# Patient Record
Sex: Female | Born: 1955
Health system: Southern US, Community
[De-identification: ages and names within clinical notes are randomized; demographics above are authoritative.]

## PROBLEM LIST (undated history)

## (undated) DIAGNOSIS — H269 Unspecified cataract: Secondary | ICD-10-CM

## (undated) DIAGNOSIS — M791 Myalgia, unspecified site: Secondary | ICD-10-CM

## (undated) DIAGNOSIS — C4491 Basal cell carcinoma of skin, unspecified: Secondary | ICD-10-CM

## (undated) DIAGNOSIS — N63 Unspecified lump in unspecified breast: Secondary | ICD-10-CM

## (undated) DIAGNOSIS — Q667 Congenital pes cavus, unspecified foot: Secondary | ICD-10-CM

## (undated) DIAGNOSIS — F419 Anxiety disorder, unspecified: Secondary | ICD-10-CM

## (undated) DIAGNOSIS — Z86010 Personal history of colonic polyps: Secondary | ICD-10-CM

## (undated) DIAGNOSIS — F329 Major depressive disorder, single episode, unspecified: Secondary | ICD-10-CM

## (undated) DIAGNOSIS — M7631 Iliotibial band syndrome, right leg: Secondary | ICD-10-CM

## (undated) DIAGNOSIS — F32A Depression, unspecified: Secondary | ICD-10-CM

## (undated) DIAGNOSIS — Z9189 Other specified personal risk factors, not elsewhere classified: Secondary | ICD-10-CM

## (undated) HISTORY — DX: Basal cell carcinoma of skin, unspecified: C44.91

## (undated) HISTORY — DX: Depression, unspecified: F32.A

## (undated) HISTORY — DX: Iliotibial band syndrome, right leg: M76.31

## (undated) HISTORY — DX: Myalgia, unspecified site: M79.10

## (undated) HISTORY — PX: NO PAST SURGERIES: SHX2092

## (undated) HISTORY — DX: Unspecified lump in unspecified breast: N63.0

## (undated) HISTORY — DX: Unspecified cataract: H26.9

## (undated) HISTORY — DX: Congenital pes cavus, unspecified foot: Q66.70

## (undated) HISTORY — DX: Other specified personal risk factors, not elsewhere classified: Z91.89

## (undated) HISTORY — DX: Major depressive disorder, single episode, unspecified: F32.9

## (undated) HISTORY — DX: Anxiety disorder, unspecified: F41.9

## (undated) HISTORY — DX: Personal history of colonic polyps: Z86.010

---

## 1996-04-26 DIAGNOSIS — N63 Unspecified lump in unspecified breast: Secondary | ICD-10-CM

## 1996-04-26 HISTORY — DX: Unspecified lump in unspecified breast: N63.0

## 1999-05-05 ENCOUNTER — Other Ambulatory Visit: Admission: RE | Admit: 1999-05-05 | Discharge: 1999-05-05 | Payer: Self-pay | Admitting: *Deleted

## 2000-07-12 ENCOUNTER — Other Ambulatory Visit: Admission: RE | Admit: 2000-07-12 | Discharge: 2000-07-12 | Payer: Self-pay | Admitting: *Deleted

## 2000-07-23 ENCOUNTER — Encounter: Admission: RE | Admit: 2000-07-23 | Discharge: 2000-07-23 | Payer: Self-pay | Admitting: *Deleted

## 2000-07-23 ENCOUNTER — Encounter: Payer: Self-pay | Admitting: *Deleted

## 2001-06-26 ENCOUNTER — Other Ambulatory Visit: Admission: RE | Admit: 2001-06-26 | Discharge: 2001-06-26 | Payer: Self-pay | Admitting: *Deleted

## 2001-08-06 ENCOUNTER — Encounter: Admission: RE | Admit: 2001-08-06 | Discharge: 2001-08-06 | Payer: Self-pay | Admitting: *Deleted

## 2001-08-06 ENCOUNTER — Encounter: Payer: Self-pay | Admitting: *Deleted

## 2002-07-03 ENCOUNTER — Other Ambulatory Visit: Admission: RE | Admit: 2002-07-03 | Discharge: 2002-07-03 | Payer: Self-pay | Admitting: *Deleted

## 2002-07-20 ENCOUNTER — Encounter: Payer: Self-pay | Admitting: Orthopedic Surgery

## 2002-07-20 ENCOUNTER — Encounter: Admission: RE | Admit: 2002-07-20 | Discharge: 2002-07-20 | Payer: Self-pay | Admitting: Orthopedic Surgery

## 2002-08-10 ENCOUNTER — Encounter: Admission: RE | Admit: 2002-08-10 | Discharge: 2002-08-10 | Payer: Self-pay | Admitting: Diagnostic Radiology

## 2002-08-10 ENCOUNTER — Encounter: Payer: Self-pay | Admitting: Diagnostic Radiology

## 2002-10-02 ENCOUNTER — Encounter: Admission: RE | Admit: 2002-10-02 | Discharge: 2002-10-02 | Payer: Self-pay | Admitting: *Deleted

## 2002-10-02 ENCOUNTER — Encounter: Payer: Self-pay | Admitting: *Deleted

## 2003-07-14 ENCOUNTER — Other Ambulatory Visit: Admission: RE | Admit: 2003-07-14 | Discharge: 2003-07-14 | Payer: Self-pay | Admitting: *Deleted

## 2003-12-07 ENCOUNTER — Encounter: Admission: RE | Admit: 2003-12-07 | Discharge: 2003-12-07 | Payer: Self-pay | Admitting: *Deleted

## 2004-07-17 ENCOUNTER — Other Ambulatory Visit: Admission: RE | Admit: 2004-07-17 | Discharge: 2004-07-17 | Payer: Self-pay | Admitting: *Deleted

## 2005-01-08 ENCOUNTER — Encounter: Admission: RE | Admit: 2005-01-08 | Discharge: 2005-01-08 | Payer: Self-pay | Admitting: *Deleted

## 2005-07-18 ENCOUNTER — Other Ambulatory Visit: Admission: RE | Admit: 2005-07-18 | Discharge: 2005-07-18 | Payer: Self-pay | Admitting: *Deleted

## 2006-01-23 ENCOUNTER — Encounter: Payer: Self-pay | Admitting: *Deleted

## 2006-01-31 ENCOUNTER — Encounter: Admission: RE | Admit: 2006-01-31 | Discharge: 2006-01-31 | Payer: Self-pay | Admitting: Obstetrics and Gynecology

## 2006-07-24 ENCOUNTER — Other Ambulatory Visit: Admission: RE | Admit: 2006-07-24 | Discharge: 2006-07-24 | Payer: Self-pay | Admitting: Obstetrics and Gynecology

## 2007-02-07 ENCOUNTER — Ambulatory Visit (HOSPITAL_COMMUNITY): Admission: RE | Admit: 2007-02-07 | Discharge: 2007-02-07 | Payer: Self-pay | Admitting: Obstetrics and Gynecology

## 2007-07-30 ENCOUNTER — Other Ambulatory Visit: Admission: RE | Admit: 2007-07-30 | Discharge: 2007-07-30 | Payer: Self-pay | Admitting: Obstetrics and Gynecology

## 2008-02-09 ENCOUNTER — Ambulatory Visit (HOSPITAL_COMMUNITY): Admission: RE | Admit: 2008-02-09 | Discharge: 2008-02-09 | Payer: Self-pay | Admitting: Obstetrics and Gynecology

## 2008-07-30 ENCOUNTER — Other Ambulatory Visit: Admission: RE | Admit: 2008-07-30 | Discharge: 2008-07-30 | Payer: Self-pay | Admitting: Obstetrics and Gynecology

## 2009-02-14 ENCOUNTER — Ambulatory Visit (HOSPITAL_COMMUNITY): Admission: RE | Admit: 2009-02-14 | Discharge: 2009-02-14 | Payer: Self-pay | Admitting: Obstetrics and Gynecology

## 2009-09-27 ENCOUNTER — Encounter (INDEPENDENT_AMBULATORY_CARE_PROVIDER_SITE_OTHER): Payer: Self-pay | Admitting: *Deleted

## 2009-10-20 ENCOUNTER — Emergency Department (HOSPITAL_COMMUNITY): Admission: EM | Admit: 2009-10-20 | Discharge: 2009-10-20 | Payer: Self-pay | Admitting: Family Medicine

## 2009-10-26 ENCOUNTER — Encounter (INDEPENDENT_AMBULATORY_CARE_PROVIDER_SITE_OTHER): Payer: Self-pay | Admitting: *Deleted

## 2009-10-26 ENCOUNTER — Ambulatory Visit: Payer: Self-pay | Admitting: Gastroenterology

## 2009-10-27 ENCOUNTER — Telehealth: Payer: Self-pay | Admitting: Gastroenterology

## 2009-11-03 ENCOUNTER — Ambulatory Visit: Payer: Self-pay | Admitting: Gastroenterology

## 2009-11-04 ENCOUNTER — Encounter: Payer: Self-pay | Admitting: Gastroenterology

## 2010-02-15 ENCOUNTER — Ambulatory Visit (HOSPITAL_COMMUNITY): Admission: RE | Admit: 2010-02-15 | Discharge: 2010-02-15 | Payer: Self-pay | Admitting: Obstetrics and Gynecology

## 2010-02-24 DIAGNOSIS — Q667 Congenital pes cavus, unspecified foot: Secondary | ICD-10-CM

## 2010-02-24 HISTORY — DX: Congenital pes cavus, unspecified foot: Q66.70

## 2010-03-23 ENCOUNTER — Ambulatory Visit: Payer: Self-pay | Admitting: Sports Medicine

## 2010-03-23 DIAGNOSIS — M25569 Pain in unspecified knee: Secondary | ICD-10-CM | POA: Insufficient documentation

## 2010-03-23 DIAGNOSIS — Q667 Congenital pes cavus, unspecified foot: Secondary | ICD-10-CM | POA: Insufficient documentation

## 2010-03-23 DIAGNOSIS — M25579 Pain in unspecified ankle and joints of unspecified foot: Secondary | ICD-10-CM | POA: Insufficient documentation

## 2010-09-20 LAB — TSH: TSH: 5.3 u[IU]/mL (ref <=5.90)

## 2010-12-28 NOTE — Assessment & Plan Note (Signed)
Summary: NP WITH R KNEE PAIN X 2 WKS AND R HEEL PAIN X 8 MOS   Vital Signs:  Patient profile:   55 year old female Height:      63 inches Weight:      110 pounds BMI:     19.56 BP sitting:   116 / 79  Vitals Entered By: Lillia Pauls CMA (March 23, 2010 4:26 PM)  History of Present Illness: Pt presents wtih right anterior knee pain that has been intermittent for the past 2 years in addition to right heel pain for 8 weeks. The right knee pain usually occurs when she crosses her legs or when she does deep knee bends. No known injuries or prior surgeries. She has been living with it but decided to come in because of a "stabbing" type of sensation that developed behind her kneecap two weeks ago for about 30 minutes. She has not had any other episodes since then. She jazzercises 5 times per week. Occasionally she gets a popping sensation but no locking or swelling.   Her right heel pain is located on the posterior lateral portion of her ankle. She feels that it stretches with the first step in the morning and is usually worse the morning after she jazzercises. No injuries or swelling. Intermittent x 8 months.     Allergies (verified): 1)  ! Codeine  Physical Exam  General:  alert and well-developed.   Head:  normocephalic and atraumatic.   Neck:  supple.   Lungs:  normal respiratory effort.   Msk:  Right Knee: Lateral tracking patella. Small VMO No effusion or bruising Full extension and flexion without pain + TTP of patellar tendon + patellar compression, grind and aprehension testing No TTP along the joint line or pes bursa Normal MCL, LCL, ACL and PCL on special testing Neg McMurray's 5/5 hip flexion and abductor testing 5/5 strength with resisted knee flexion and extension  Left Knee: Lateral tracking patella, small VMO No effusion or bruising Full ROM Neg patellar testing No TTP throughout the joint Normal ACL, PCL, LCL and MCL with special testing Neg McMurray's 5/5  strength with resisted knee flexion and extension 5/5 hip flexion and abductor testing  Equal leg lengths  Right Foot and Ankle: Mild bone spur visible over lateral ankle distal to lateral maleolus No swelling or bruisng No TTP along the achilles tendon, base of the 5th MT, navicular, medial and lateral maleoli + TTP over peroneal tendons at the lateral distal ankle Full ROM of her ankle 5/5 strength with resisted ankle ROM testing Neg anterior drawer sign Able to bear weight easily  Left Foot and Ankle: Normal inspection, palpation, ROM and strength Neg anterior drawer sign Able to bear weight easily   Impression & Recommendations:  Problem # 1:  KNEE PAIN, RIGHT (ICD-719.46) PFS of the right knee 1. Given a Cho-pat strap to wear during activities (body helix) 2. Given knee and hip strengthening exercises to do daily 3. Ice patellar tendon for 20 minutes daily 4. OTC NSAIDs as needed pain 5. Sports insoles to cushion her impact and provide arch support  Orders: Garment,belt,sleeve or other covering ,elastic or similar stretch ([id number])  Problem # 2:  ANKLE PAIN (ICD-719.47)  Likely due to peroneal tendon strain or irritation 1. Given sports insoles to treat her pes cavus which is causing the strain on these tendons especially since she likes wearing heels 2. Return as needed in 4-6 weeks 3. Ice for 20 minutes if swelling develops  Orders: Sports Insoles [id number])  Problem # 3:  TALIPES CAVUS (ICD-754.71)  1. Sports insoles for arch support  Orders: Sports Insoles ([id number])

## 2011-01-25 ENCOUNTER — Other Ambulatory Visit: Payer: Self-pay | Admitting: Obstetrics and Gynecology

## 2011-01-25 DIAGNOSIS — Z1231 Encounter for screening mammogram for malignant neoplasm of breast: Secondary | ICD-10-CM

## 2011-02-28 ENCOUNTER — Ambulatory Visit (HOSPITAL_COMMUNITY)
Admission: RE | Admit: 2011-02-28 | Discharge: 2011-02-28 | Disposition: A | Discharge status: Home / Self Care | Payer: 59 | Source: Ambulatory Visit | Attending: Obstetrics and Gynecology | Admitting: Obstetrics and Gynecology

## 2011-02-28 DIAGNOSIS — Z1231 Encounter for screening mammogram for malignant neoplasm of breast: Secondary | ICD-10-CM

## 2011-02-28 LAB — HM MAMMOGRAPHY: HM Mammogram: NORMAL

## 2011-03-13 LAB — HM DIABETES EYE EXAM: HM Diabetic Eye Exam: NORMAL

## 2011-04-02 ENCOUNTER — Encounter: Payer: Self-pay | Admitting: Internal Medicine

## 2011-04-02 ENCOUNTER — Other Ambulatory Visit (INDEPENDENT_AMBULATORY_CARE_PROVIDER_SITE_OTHER): Payer: 59

## 2011-04-02 ENCOUNTER — Telehealth: Payer: Self-pay | Admitting: Internal Medicine

## 2011-04-02 ENCOUNTER — Ambulatory Visit (INDEPENDENT_AMBULATORY_CARE_PROVIDER_SITE_OTHER): Payer: 59 | Admitting: Internal Medicine

## 2011-04-02 VITALS — BP 112/82 | HR 59 | Temp 98.6°F | Ht 63.5 in | Wt 111.1 lb

## 2011-04-02 DIAGNOSIS — Z0000 Encounter for general adult medical examination without abnormal findings: Secondary | ICD-10-CM

## 2011-04-02 LAB — LIPID PANEL
Cholesterol: 173 mg/dL (ref 0–200)
LDL Cholesterol: 87 mg/dL (ref 0–99)

## 2011-04-02 LAB — CBC WITH DIFFERENTIAL/PLATELET
Basophils Relative: 0.8 % (ref 0.0–3.0)
Eosinophils Relative: 1.6 % (ref 0.0–5.0)
HCT: 36.8 % (ref 36.0–46.0)
Hemoglobin: 12.7 g/dL (ref 12.0–15.0)
Lymphocytes Relative: 33.8 % (ref 12.0–46.0)
MCHC: 34.5 g/dL (ref 30.0–36.0)
Platelets: 278 10*3/uL (ref 150.0–400.0)
RDW: 13.7 % (ref 11.5–14.6)
WBC: 4 10*3/uL — ABNORMAL LOW (ref 4.5–10.5)

## 2011-04-02 LAB — URINALYSIS
Hgb urine dipstick: NEGATIVE
Ketones, ur: NEGATIVE
Leukocytes, UA: NEGATIVE
Specific Gravity, Urine: <=1.005 (ref 1.000–1.030)
Total Protein, Urine: NEGATIVE
Urine Glucose: NEGATIVE

## 2011-04-02 LAB — HEPATIC FUNCTION PANEL
Albumin: 3.7 g/dL (ref 3.5–5.2)
Alkaline Phosphatase: 26 U/L — ABNORMAL LOW (ref 39–117)
Bilirubin, Direct: 0 mg/dL (ref 0.0–0.3)
Total Bilirubin: 0.6 mg/dL (ref 0.3–1.2)

## 2011-04-02 LAB — BASIC METABOLIC PANEL
CO2: 29 mEq/L (ref 19–32)
Sodium: 137 mEq/L (ref 135–145)

## 2011-04-02 LAB — TSH: TSH: 2.37 u[IU]/mL (ref 0.35–5.50)

## 2011-04-02 NOTE — Progress Notes (Signed)
  Subjective:    Patient ID: Laura Munoz, female    DOB: 1956/11/07, 55 y.o.   MRN: [id number]  HPI  New pt to me and our practice, here to establish care- Also patient is here today for annual physical. Patient feels well and has no complaints. Reviewed labs from 08/2010 employer health screen - mild increase BUN and TSH - no hx same  Past Medical History  Diagnosis Date  . Hx of colonic polyps    Family History  Problem Relation Age of Onset  . Prostate cancer Father   . Dementia Father   . Breast cancer Paternal Grandmother   . Osteopenia Mother    History  Substance Use Topics  . Smoking status: Never Smoker   . 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 Alcohol Use: Yes     Review of Systems  Constitutional: Negative for fever.  Respiratory: Negative for cough and shortness of breath.   Cardiovascular: Negative for chest pain.  Gastrointestinal: Negative for abdominal pain.  Musculoskeletal: Negative for gait problem.  Skin: Negative for rash.  Neurological: Negative for dizziness.  No other specific complaints in a complete review of systems (except as listed in HPI above).     Objective:   Physical Exam BP 112/82  Pulse 59  Temp(Src) 98.6 F (37 C) (Oral)  Ht 5' 3.5" (1.613 m)  Wt 111 lb 1.9 oz (50.404 kg)  BMI 19.38 kg/m2  SpO2 96% Physical Exam  Constitutional: Fit, thin and healthy appearing. She is oriented to person, place, and time. She appears well-developed and well-nourished. No distress.  HENT: Head: Normocephalic and atraumatic.  Eas: B TMs clear; Nose: Nose normal.  Mouth/Throat: Oropharynx is clear and moist. No oropharyngeal exudate.  Eyes: Conjunctivae and EOM are normal. Pupils are equal, round, and reactive to light. No scleral icterus.  Neck: Normal range of motion. Neck supple. No JVD present. No thyromegaly present.  Cardiovascular: Normal rate, regular rhythm and normal  heart sounds.  No murmur heard. Pulmonary/Chest: Effort normal and breath sounds normal. No respiratory distress. She has no wheezes.  Abdominal: Soft. Bowel sounds are normal. She exhibits no distension. There is no tenderness.  Musculoskeletal: Normal range of motion. She exhibits no edema.  Neurological: She is alert and oriented to person, place, and time. No cranial nerve deficit. Coordination normal.  Skin: Skin is warm and dry. No rash noted. No erythema.  Psychiatric: She has a normal mood and affect. Her behavior is normal. Judgment and thought content normal.        No results found for this basename: WBC,  HGB,  HCT,  PLT,  CHOL,  TRIG,  HDL,  LDLDIRECT,  ALT,  AST,  NA,  K,  CL,  CREATININE,  BUN,  CO2,  TSH,  PSA,  INR,  GLUF,  HGBA1C,  MICROALBUR     Assessment & Plan:  CPX - v70.0 -Patient has been counseled on age-appropriate routine health concerns for screening and prevention. These are reviewed and up-to-date. Immunizations are up-to-date or declined. Labs and ECG reviewed (brady, sinus - no arrythmia)

## 2011-04-02 NOTE — Telephone Encounter (Signed)
Please call pt: CPX labs ok - BUN and TSH recheck in normal range - no medication changes recommended - please call if any problems before next year! Thanks Lab Results  Component Value Date   BUN 20 04/02/2011   Lab Results  Component Value Date   TSH 2.37 04/02/2011

## 2011-04-02 NOTE — Patient Instructions (Addendum)
It was good to see you today. We have reviewed your prior records including labs and tests today, exam looks good! Test(s) ordered today. Your results will be called to you after review (48-72hours after test completion). If any changes need to be made, you will be notified at that time. Medications reviewed, no changes at this time. Will schedule for bone density as discussed - you will be called with results once available Please schedule followup annually, call sooner if problems.

## 2011-04-03 NOTE — Telephone Encounter (Signed)
Pt Notified with lab results...04/03/11@12 :03pm/LMB

## 2011-04-04 ENCOUNTER — Ambulatory Visit (INDEPENDENT_AMBULATORY_CARE_PROVIDER_SITE_OTHER)
Admission: RE | Admit: 2011-04-04 | Discharge: 2011-04-04 | Disposition: A | Discharge status: Home / Self Care | Payer: 59 | Source: Ambulatory Visit

## 2011-04-04 DIAGNOSIS — Z13820 Encounter for screening for osteoporosis: Secondary | ICD-10-CM

## 2011-04-04 DIAGNOSIS — Z0000 Encounter for general adult medical examination without abnormal findings: Secondary | ICD-10-CM

## 2011-04-17 ENCOUNTER — Telehealth: Payer: Self-pay | Admitting: *Deleted

## 2011-04-17 NOTE — Telephone Encounter (Signed)
MD received bone density results. Tried to call pt no ansew LMOM RTC...04/17/11@2 :06pm/LMB

## 2011-04-17 NOTE — Telephone Encounter (Signed)
Pt return callback concerning bone density gave results...04/17/11@ 3:11pm/LMB

## 2011-04-19 ENCOUNTER — Encounter: Payer: Self-pay | Admitting: Internal Medicine

## 2011-05-15 ENCOUNTER — Ambulatory Visit (INDEPENDENT_AMBULATORY_CARE_PROVIDER_SITE_OTHER): Payer: 59 | Admitting: Family Medicine

## 2011-05-15 DIAGNOSIS — M7660 Achilles tendinitis, unspecified leg: Secondary | ICD-10-CM

## 2011-05-16 NOTE — Progress Notes (Signed)
  Subjective:    Patient ID: Laura Munoz, female    DOB: [birth year], 55 y.o.   MRN: [id number]  HPI  Left calf pain which is mostly a sense of burning several days. She is active in [employer] and notices it most during the latter portion of the class and then later. She'll have intermittent episodes of burning pain throughout the next day. She's had no specific injury. She's had no prior problems with the For Achilles on that leg, no surgeries.  Review of Systems Denies any recent weight change.    Objective:   Physical Exam     GENERAL: Well-developed well-nourished no acute distress LOWER EXTREMITY: Symmetrical calf development. Normal muscle bulk and tone bilaterally slight tenderness to palpation left lateral Achilles tendon area about 6 cm from the lateral malleolus and one centimeter lateral to the border of the Achilles. Normal flexion and extension at the ankle. The left lower extremity is neurovascularly intact.  Musculoskeletal ultrasound: Bilateral Achilles tendons are intact with no sign of calcification, no sign of edema and no defect. They are normal in diameter and symmetrical. They're homogeneous. The area on the left posterior calf that is tender to palpation has a very small amount of edema in the soleus/gastrocnemius muscle area. This is very focal and indistinct.    Assessment & Plan:  #1. Calf pain. Seems like a small muscle strain. The Achilles is intact. She's been doing off a lot of stretching I would stop stretching for a week, ice and then returned gradually over the next 2 weeks to her regular activity. If it's not improving or telemetry resolving within the next 2-3 weeks to let us know.

## 2011-09-27 DIAGNOSIS — M7631 Iliotibial band syndrome, right leg: Secondary | ICD-10-CM

## 2011-09-27 HISTORY — DX: Iliotibial band syndrome, right leg: M76.31

## 2011-10-08 ENCOUNTER — Ambulatory Visit (INDEPENDENT_AMBULATORY_CARE_PROVIDER_SITE_OTHER): Payer: 59 | Admitting: Family Medicine

## 2011-10-08 VITALS — BP 110/78

## 2011-10-08 DIAGNOSIS — M7631 Iliotibial band syndrome, right leg: Secondary | ICD-10-CM

## 2011-10-08 DIAGNOSIS — M629 Disorder of muscle, unspecified: Secondary | ICD-10-CM

## 2011-10-08 MED ORDER — FLURBIPROFEN 100 MG PO TABS: 100.0000 mg | ORAL_TABLET | Freq: Two times a day (BID) | ORAL | Status: DC

## 2011-10-08 NOTE — Patient Instructions (Addendum)
Mrs. Gopal,  Thank you for coming in today.  It was a pleasure meeting a fellow [employer]!  Your hip pain appears to be for a tight IT band. Please do the following: 1. Heat followed by ice for 15 mins each once to to twice daily. 2. Start strengthening exercises after your hip feels better.   Please follow-up as needed for worsening pain or pain that does not improve.   -Dr. Craige Cotta.

## 2011-10-09 DIAGNOSIS — M7631 Iliotibial band syndrome, right leg: Secondary | ICD-10-CM | POA: Insufficient documentation

## 2011-10-09 NOTE — Progress Notes (Signed)
  Subjective:    Patient ID: Laura Munoz, female    DOB: [birth year], 55 y.o.   MRN: [id number]  HPI  Right hip pain that started after doing a double leg split at yoga. She usually does a modified type split. Felt some pain at that time but it has continued to bother her over the last 2-3 weeks. Radiates down the side of her leg. No leg weakness. No falls. No numbness. There is a mild pain right below her knee is mostly soreness. Very active in [employer] and has continued to be despite the pain.  Review of Systems Pertinent review of systems: negative for fever or unusual weight change.     Objective:   Physical Exam  Vital signs reviewed. GENERAL: Well developed, well nourished, no acute distress HIP: Bilaterally hips have full range of motion in internal and external rotation. Laura Munoz is negative bilaterally. She is mildly tender to palpation over the right iliotibial band with a torsion at its insertion below the knee. Cross leg abduction of the right leg causes increase in her pain. Leg length discrepancy of left leg shorter by 2.5 cm. Gait analysis reveals a slight drop in the left hip during left leg stance phase.      Assessment & Plan:  #1. Iliotibial band strain. Placed her on conservative rest, hip strengthening program. She will use NSAIDs when necessary. I did refill that for her. Recommended no more double leg splits. #2. Leg length discrepancy. I doubt this is significant clinically that may predispose her to some iliotibial band issues. Strengthening program as above. Return to clinic when necessary. Should she not have resolution with the above measures I would consider greater trochanteric bursa injection.

## 2012-02-08 ENCOUNTER — Other Ambulatory Visit: Payer: Self-pay | Admitting: Obstetrics and Gynecology

## 2012-02-08 DIAGNOSIS — Z1231 Encounter for screening mammogram for malignant neoplasm of breast: Secondary | ICD-10-CM

## 2012-03-07 ENCOUNTER — Ambulatory Visit (HOSPITAL_COMMUNITY): Payer: 59

## 2012-03-13 ENCOUNTER — Encounter: Payer: Self-pay | Admitting: Internal Medicine

## 2012-03-31 ENCOUNTER — Ambulatory Visit (HOSPITAL_COMMUNITY)
Admission: RE | Admit: 2012-03-31 | Discharge: 2012-03-31 | Disposition: A | Discharge status: Home / Self Care | Payer: 59 | Source: Ambulatory Visit | Attending: Obstetrics and Gynecology | Admitting: Obstetrics and Gynecology

## 2012-03-31 DIAGNOSIS — Z1231 Encounter for screening mammogram for malignant neoplasm of breast: Secondary | ICD-10-CM | POA: Insufficient documentation

## 2012-04-02 ENCOUNTER — Encounter: Payer: 59 | Admitting: Internal Medicine

## 2012-04-03 ENCOUNTER — Encounter: Payer: 59 | Admitting: Internal Medicine

## 2012-04-10 ENCOUNTER — Encounter: Payer: Self-pay | Admitting: Internal Medicine

## 2012-04-10 ENCOUNTER — Ambulatory Visit (INDEPENDENT_AMBULATORY_CARE_PROVIDER_SITE_OTHER): Payer: 59 | Admitting: Internal Medicine

## 2012-04-10 ENCOUNTER — Other Ambulatory Visit (INDEPENDENT_AMBULATORY_CARE_PROVIDER_SITE_OTHER): Payer: 59

## 2012-04-10 VITALS — BP 122/72 | HR 65 | Temp 98.6°F | Ht 63.0 in | Wt 110.0 lb

## 2012-04-10 DIAGNOSIS — F4323 Adjustment disorder with mixed anxiety and depressed mood: Secondary | ICD-10-CM | POA: Insufficient documentation

## 2012-04-10 DIAGNOSIS — Z0000 Encounter for general adult medical examination without abnormal findings: Secondary | ICD-10-CM

## 2012-04-10 LAB — URINALYSIS, ROUTINE W REFLEX MICROSCOPIC
Nitrite: NEGATIVE
Urine Glucose: NEGATIVE
Urobilinogen, UA: 0.2 (ref 0.0–1.0)

## 2012-04-10 LAB — LIPID PANEL
Cholesterol: 160 mg/dL (ref 0–200)
LDL Cholesterol: 73 mg/dL (ref 0–99)
Total CHOL/HDL Ratio: 2

## 2012-04-10 LAB — HEPATIC FUNCTION PANEL
AST: 25 U/L (ref 0–37)
Alkaline Phosphatase: 24 U/L — ABNORMAL LOW (ref 39–117)
Bilirubin, Direct: 0 mg/dL (ref 0.0–0.3)
Total Protein: 7.1 g/dL (ref 6.0–8.3)

## 2012-04-10 LAB — CBC WITH DIFFERENTIAL/PLATELET
Basophils Absolute: 0 10*3/uL (ref 0.0–0.1)
Eosinophils Absolute: 0.1 10*3/uL (ref 0.0–0.7)
MCHC: 33.5 g/dL (ref 30.0–36.0)
MCV: 92 fl (ref 78.0–100.0)
Monocytes Absolute: 0.4 10*3/uL (ref 0.1–1.0)
Neutrophils Relative %: 51.6 % (ref 43.0–77.0)
Platelets: 272 10*3/uL (ref 150.0–400.0)

## 2012-04-10 LAB — BASIC METABOLIC PANEL
BUN: 23 mg/dL (ref 6–23)
CO2: 28 mEq/L (ref 19–32)
Calcium: 9.4 mg/dL (ref 8.4–10.5)
Chloride: 102 mEq/L (ref 96–112)
Creatinine, Ser: 1 mg/dL (ref 0.4–1.2)

## 2012-04-10 MED ORDER — SERTRALINE HCL 25 MG PO TABS: 25.0000 mg | ORAL_TABLET | Freq: Every day | ORAL | Status: DC

## 2012-04-10 NOTE — Progress Notes (Signed)
Subjective:    Patient ID: Laura Munoz, female    DOB: 1956/06/14, 56 y.o.   MRN: [id number]  HPI patient is here today for annual physical. Patient feels well and has no complaints.  ?wean down from current sertraline dose - feels well, no anxiety or depression, "numb" at times  Past Medical History  Diagnosis Date  . Hx of colonic polyps   . Iliotibial band syndrome of right side 09/2011  . Talipes cavus 02/2010  . At risk for decreased bone density     DEXA 03/2011 normal (-1.0 at R fem)   Family History  Problem Relation Age of Onset  . Prostate cancer Father   . Dementia Father   . Breast cancer Paternal Grandmother   . Osteopenia Mother    History  Substance Use Topics  . Smoking status: Never Smoker   . 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 Alcohol Use: Yes    Review of Systems Constitutional: Negative for fever or weight change.  Respiratory: Negative for cough and shortness of breath.   Cardiovascular: Negative for chest pain or palpitations.  Gastrointestinal: Negative for abdominal pain, no bowel changes.  Musculoskeletal: Negative for gait problem or joint swelling.  Skin: Negative for rash.  Neurological: Negative for dizziness or headache.  No other specific complaints in a complete review of systems (except as listed in HPI above).     Objective:   Physical Exam BP 122/72  Pulse 65  Temp(Src) 98.6 F (37 C) (Oral)  Ht 5\' 3"  (1.6 m)  Wt 110 lb (49.896 kg)  BMI 19.49 kg/m2  SpO2 98% Wt Readings from Last 3 Encounters:  04/10/12 110 lb (49.896 kg)  04/02/11 111 lb 1.9 oz (50.404 kg)  03/23/10 110 lb (49.896 kg)   Constitutional: She thin/fit, appears well-developed and well-nourished. No distress.  HENT: Head: Normocephalic and atraumatic. Ears: B TMs ok, no erythema or effusion; Nose: Nose normal. Mouth/Throat: Oropharynx is clear and moist. No oropharyngeal exudate.  Eyes:  Conjunctivae and EOM are normal. Pupils are equal, round, and reactive to light. No scleral icterus.  Neck: Normal range of motion. Neck supple. No JVD present. No thyromegaly present.  Cardiovascular: Normal rate, regular rhythm and normal heart sounds.  No murmur heard. No BLE edema. Pulmonary/Chest: Effort normal and breath sounds normal. No respiratory distress. She has no wheezes.  Abdominal: Soft. Bowel sounds are normal. She exhibits no distension. There is no tenderness. no masses Musculoskeletal: Normal range of motion, no joint effusions. No gross deformities Neurological: She is alert and oriented to person, place, and time. No cranial nerve deficit. Coordination normal.  Skin: Skin is warm and dry. No rash noted. No erythema.  Psychiatric: She has a normal mood and affect. Her behavior is normal. Judgment and thought content normal.   Lab Results  Component Value Date   WBC 4.0* 04/02/2011   HGB 12.7 04/02/2011   HCT 36.8 04/02/2011   PLT 278.0 04/02/2011   GLUCOSE 94 04/02/2011   CHOL 173 04/02/2011   TRIG 80.0 04/02/2011   HDL 70.00 04/02/2011   LDLCALC 87 04/02/2011   ALT 14 04/02/2011   AST 22 04/02/2011   NA 137 04/02/2011   K 4.6 04/02/2011   CL 102 04/02/2011   CREATININE 0.8 04/02/2011   BUN 20 04/02/2011   CO2 29 04/02/2011   TSH 2.37 04/02/2011   ECG: sinus @ 62 bpm - no ischemic changes or  arrhythmias      Assessment & Plan:  CPX/v70.0 - Patient has been counseled on age-appropriate routine health concerns for screening and prevention. These are reviewed and up-to-date. Immunizations are up-to-date or declined. Labs ordered/to be reviewed and ECG reviewed.  Situational stress with anxiety/depression - major life changes in past year (job change, death of parent, relationship stress) - increase sertraline to "cope" with same 08/2011 - now stabilizing - will wean back to sertraline 50mg  over next 2-3 months - consider further reduction based on symptoms at that time - pt will call sooner if  probelms

## 2012-04-10 NOTE — Patient Instructions (Signed)
It was good to see you today. Test(s) ordered today. Your results will be called to you after review (48-72hours after test completion). If any changes need to be made, you will be notified at that time. Health Maintenance reviewed - all recommended immunizations and age-appropriate screenings are up-to-date.  Decrease sertraline dose to 75 mg daily for 4 weeks, then 50 mg daily - Your prescription(s) have been submitted to your pharmacy. Please take as directed and contact our office if you believe you are having problem(s) with the medication(s). If further reduction in sertraline is desired, please call for office visit (3-6 months) to review Please schedule followup in 1 year for medical physical and labs, call sooner if problems.

## 2012-11-13 ENCOUNTER — Ambulatory Visit (INDEPENDENT_AMBULATORY_CARE_PROVIDER_SITE_OTHER): Payer: 59 | Admitting: Family Medicine

## 2012-11-13 VITALS — BP 118/76 | HR 68 | Temp 98.3°F | Resp 16 | Ht 63.0 in | Wt 111.0 lb

## 2012-11-13 DIAGNOSIS — J329 Chronic sinusitis, unspecified: Secondary | ICD-10-CM

## 2012-11-13 MED ORDER — AMOXICILLIN 875 MG PO TABS: 875.0000 mg | ORAL_TABLET | Freq: Two times a day (BID) | ORAL | Status: DC

## 2012-11-13 NOTE — Patient Instructions (Signed)

## 2012-11-13 NOTE — Progress Notes (Signed)
Patient ID: Laura Munoz MRN: [id number], DOB: 09/15/1956, 56 y.o. Date of Encounter: 11/13/2012, 12:44 PM  Primary Physician: Rene Paci, MD  Chief Complaint:  Chief Complaint  Patient presents with  . sinus congestion    X 6 days  . Otalgia    mainly on the LT side comes and goes  . Cough    improved but comes and goes, more in the AM    HPI: 56 y.o. year old female presents with 5 day history of nasal congestion, post nasal drip, sore throat, sinus pressure, and cough. Afebrile. No chills. Nasal congestion thick and green/yellow. Sinus pressure is the worst symptom. Cough is productive secondary to post nasal drip and not associated with time of day. Ears feel full, leading to sensation of muffled hearing. Has tried OTC cold preps without success. No GI complaints.   No recent antibiotics, recent travels, or sick contacts   No leg trauma, sedentary periods, h/o cancer, or tobacco use.  Past Medical History  Diagnosis Date  . Hx of colonic polyps   . Iliotibial band syndrome of right side 09/2011  . Talipes cavus 02/2010  . At risk for decreased bone density     DEXA 03/2011 normal (-1.0 at R fem)     Home Meds: Prior to Admission medications   Medication Sig Start Date End Date Taking? Authorizing Provider  Calcium Carbonate-Vit D-Min (CALCIUM 1200 PO) Take by mouth daily.     Yes Historical Provider, MD  estrogen, conjugated,-medroxyprogesterone (PREMPRO) 0.625-2.5 MG per tablet Take 1 tablet by mouth daily.     Yes Historical Provider, MD  Multiple Vitamin (MULTIVITAMIN) tablet Take 1 tablet by mouth daily.     Yes Historical Provider, MD  sertraline (ZOLOFT) 100 MG tablet Take 0.5 tablets (50 mg total) by mouth daily. As directed 04/10/12  Yes Newt Lukes, MD  sertraline (ZOLOFT) 25 MG tablet Take 1 tablet (25 mg total) by mouth daily. As directed 04/10/12 04/10/13 Yes Newt Lukes, MD  amoxicillin (AMOXIL) 875 MG tablet Take 1 tablet (875 mg total)  by mouth 2 (two) times daily. 11/13/12   Elvina Sidle, MD  flurbiprofen (ANSAID) 100 MG tablet Take 100 mg by mouth 2 (two) times daily as needed. 10/08/11 10/07/12  Dessa Phi, MD    Allergies:  Allergies  Allergen Reactions  . Codeine     History   Social History  . Marital Status: Married    Spouse Name: N/A    Number of Children: N/A  . Years of Education: N/A   Occupational History  . Not on file.   Social History Main Topics  . Smoking status: Never Smoker   . 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 Alcohol Use: 2.5 oz/week    5 drink(s) per week  . Drug Use: No  . Sexually Active: Not on file   Other Topics Concern  . Not on file   Social History Narrative  . No narrative on file     Review of Systems: Constitutional: negative for chills, fever, night sweats or weight changes Cardiovascular: negative for chest pain or palpitations Respiratory: negative for hemoptysis, wheezing, or shortness of breath Abdominal: negative for abdominal pain, nausea, vomiting or diarrhea Dermatological: negative for rash Neurologic: negative for headache   Physical Exam: Blood pressure 118/76, pulse 68, temperature 98.3 F (36.8 C), temperature source Oral, resp. rate 16, height 5\' 3"  (1.6 m),  weight 111 lb (50.349 kg), SpO2 100.00%., Body mass index is 19.66 kg/(m^2). General: Well developed, well nourished, in no acute distress. Head: Normocephalic, atraumatic, eyes without discharge, sclera non-icteric, nares are congested. Bilateral auditory canals clear, TM's are without perforation, pearly grey with reflective cone of light bilaterally. Serous effusion bilaterally behind TM's. Maxillary sinus TTP. Oral cavity moist, dentition normal. Posterior pharynx with post nasal drip and mild erythema. No peritonsillar abscess or tonsillar exudate. Neck: Supple. No thyromegaly. Full ROM. No lymphadenopathy. Lungs:  Clear bilaterally to auscultation without wheezes, rales, or rhonchi. Breathing is unlabored.  Heart: RRR with S1 S2. No murmurs, rubs, or gallops appreciated. Msk:  Strength and tone normal for age. Extremities: No clubbing or cyanosis. No edema. Neuro: Alert and oriented X 3. Moves all extremities spontaneously. CNII-XII grossly in tact. Psych:  Responds to questions appropriately with a normal affect.     ASSESSMENT AND PLAN:  56 y.o. year old female with sinusitis 1. Sinusitis  amoxicillin (AMOXIL) 875 MG tablet    - -Rest/fluids -RTC precautions -RTC 3-5 days if no improvement  Signed, Elvina Sidle, MD 11/13/2012 12:44 PM

## 2012-12-02 ENCOUNTER — Ambulatory Visit (INDEPENDENT_AMBULATORY_CARE_PROVIDER_SITE_OTHER): Payer: 59 | Admitting: Sports Medicine

## 2012-12-02 VITALS — BP 114/70 | Ht 63.0 in | Wt 111.0 lb

## 2012-12-02 DIAGNOSIS — M79669 Pain in unspecified lower leg: Secondary | ICD-10-CM

## 2012-12-02 DIAGNOSIS — M79609 Pain in unspecified limb: Secondary | ICD-10-CM

## 2012-12-02 NOTE — Assessment & Plan Note (Signed)
We started her on compression sleeve Icing and elevation Gentle motion for the calf Nitroglycerin one quarter patch Heel lift  Recheck in one week and if the swelling goes down we will start her on a home exercise program  She was given warning about the possibility of DVT and to watch for any extensive swelling

## 2012-12-02 NOTE — Progress Notes (Signed)
  Subjective:    Patient ID: Laura Munoz, female    DOB: 1956-06-29, 57 y.o.   MRN: [id number]  HPI  Pt presents to clinic for evaluation of acute left calf pain that started last night while she was exercising. States she was at a jazzercise class took a step back and felt sharp pain.  Having pain with weight bearing now.  Area very tender.   Did not have time to do the normal warmup and stretching that she often does before this class.  She had immediate pain. She went home and iced the leg that difficulty walking since the time of the injury.   Review of Systems     Objective:   Physical Exam  Patient walks with an obvious limp  Left calf shows an area of swelling and flattening along the medial head of the gastrocnemius muscle This measures 1 and 1/2 cm larger than the right calf at the same level She was exquisitely point tender No bruising or discoloration seen Achilles tendon intact Cavus foot shape  Musculoskeletal ultrasound There is hypoechoic change at the insertion of the medial head of the gastrocnemius into the fascia This is seen in the distal fibers  there is disruption of the normal pattern On transverse view the same 60 and across about 10% of the width of the medial head Soleus muscle looks intact      Assessment & Plan:

## 2012-12-02 NOTE — Patient Instructions (Addendum)
Wear calf compression sleeve as tolerated during the day, do not sleep in sleeve  Ice several times during the day  Try heel lifts in your shoes  Start heel raise exercises on a 2-4 inch book after a few days- use both feet instead of just the left  Please follow up in 1 week  Thank you for seeing [place] today!

## 2012-12-10 ENCOUNTER — Ambulatory Visit (INDEPENDENT_AMBULATORY_CARE_PROVIDER_SITE_OTHER): Payer: 59 | Admitting: Sports Medicine

## 2012-12-10 VITALS — BP 98/70 | Ht 63.0 in | Wt 110.0 lb

## 2012-12-10 DIAGNOSIS — M79609 Pain in unspecified limb: Secondary | ICD-10-CM

## 2012-12-10 DIAGNOSIS — M79669 Pain in unspecified lower leg: Secondary | ICD-10-CM

## 2012-12-10 NOTE — Assessment & Plan Note (Signed)
Excellent progress  Continue compression and icing in the evenings Heel lifts  Begin a gradual increase in her exercise program and then recheck in 3 weeks with a repeat ultrasound

## 2012-12-10 NOTE — Patient Instructions (Addendum)
  Ice for 5 to 10 minutes at end of day or after exercise  Increase the exercise by moving to a step  When you can do 3 sets of 15 / both legs/ knees straight and bent/ you can progress  Start with no more than 5 repeats using just left leg - pain level has to be mild < 3/10  Every 3rd day you can add 2 repeats  Once you can do 15 on 1 leg start adding a second set And then after 1 week a 3rd set  Recheck in 3 weeks

## 2012-12-10 NOTE — Progress Notes (Signed)
Patient ID: Laura Munoz, female   DOB: [birth year], 57 y.o.   MRN: [id number]  9 days after injury Much less calf pain She was walking with a heel lift on that along with some heel buildup in her shoes has been comfortable. She is using a compression sleeve which helps. She is aggressive icing at night. The day that she's had substantial pain was on Saturday when she was on her feet a lot. She is doing some gentle modified exercises on a book.   Physical examination Left calf now shows no obvious swelling and when she does a heel raise I can see the definition of her medial gastrocnemius muscle Circumference measurement which was 1/2 cm larger is now the same bilaterally There is bruising of the skin just below the left calf She can do heel raises on a step without any substantial pain  Ultrasound scan The area of small tear in her medial portion of her medial gastrocnemius muscle now has minimal hypoechoic change The tissue is somewhat disordered but there is no gap or increase in tearing Doppler flow shows some increased blood flow to the area Overall this appears substantially more normal than her scan 8 days ago

## 2013-01-01 ENCOUNTER — Ambulatory Visit (INDEPENDENT_AMBULATORY_CARE_PROVIDER_SITE_OTHER): Payer: 59 | Admitting: Sports Medicine

## 2013-01-01 VITALS — BP 124/83 | Ht 63.0 in | Wt 111.0 lb

## 2013-01-01 DIAGNOSIS — M79609 Pain in unspecified limb: Secondary | ICD-10-CM

## 2013-01-01 DIAGNOSIS — M79669 Pain in unspecified lower leg: Secondary | ICD-10-CM

## 2013-01-01 NOTE — Progress Notes (Signed)
Patient ID: Laura Munoz, female   DOB: [birth year], 57 y.o.   MRN: [id number]  Patient is now ~ 4 weeks since a tear of medial gastroc Severe pain at first Responded very well to rehab, compression and heel lift Now able to do most activity with no pain Has advanced HEP to where she is able to do several calf/ heel raises on 1 leg but not 3 sets  Pain only when she has done a little too much No swelling now  Exam Calf definition is good now No TTP at medial gastroc No discoloration Full function Nl strength  [place] There is minimal change at area of previous tearing with fibers showing only slt hypoechoic increase Some evidence of old hematoma that seems to be more gray scale - not as hypoechoic Norm doppler function Tear is no longer visualized

## 2013-01-01 NOTE — Patient Instructions (Signed)
Continue exercises Goal is to be able to do 15 reps x 3 on left knee   Straight knee   Then with 20 deg knee bend   Follow-up in 3 weeks

## 2013-01-01 NOTE — Assessment & Plan Note (Signed)
Cont to steadily increase activity as long as no increase in pain Use compression Build HEP on 1 leg  Avoid too aggressive of step activity until I receck in ~ 4 weeks

## 2013-01-10 ENCOUNTER — Other Ambulatory Visit: Payer: Self-pay

## 2013-01-21 ENCOUNTER — Ambulatory Visit (INDEPENDENT_AMBULATORY_CARE_PROVIDER_SITE_OTHER): Payer: 59 | Admitting: Sports Medicine

## 2013-01-21 ENCOUNTER — Encounter: Payer: Self-pay | Admitting: Sports Medicine

## 2013-01-21 VITALS — HR 64 | Ht 63.0 in | Wt 111.0 lb

## 2013-01-21 DIAGNOSIS — M79662 Pain in left lower leg: Secondary | ICD-10-CM

## 2013-01-21 DIAGNOSIS — M654 Radial styloid tenosynovitis [de Quervain]: Secondary | ICD-10-CM

## 2013-01-21 DIAGNOSIS — M79609 Pain in unspecified limb: Secondary | ICD-10-CM

## 2013-01-21 MED ORDER — MELOXICAM 15 MG PO TABS: 15.0000 mg | ORAL_TABLET | Freq: Every day | ORAL | Status: DC

## 2013-01-21 NOTE — Assessment & Plan Note (Signed)
Patient has what appears to be a strain of the left soleus muscle. This is likely her stretching a regular basis and potentially overuse injury. Patient encouraged to decrease the amount of exercise is down to 3 times a week for this. Patient can continue all her other regular exercise activities but still to avoid any high impact exercises. Patient will continue to wear the compression sleeve and take anti-inflammatories if necessary and was given a prescription for meloxicam. Warned her of potential side effects. Patient followup again in 6-8 weeks if necessary.

## 2013-01-21 NOTE — Progress Notes (Signed)
Chief complaint left calf pain  History of present illness: Patient is following up for left calf medial gastroc strain. Patient did have a compression sleeve and exercise given to her. Since that time she has made improvement. Patient states that she was doing very well up to yesterday when she was stretching and felt pain on the lateral aspect of her left calf. Patient states that this did not hurt as much as the other one but unfortunately does give her some discomfort especially to palpation at this time. Patient denies any swelling. Patient states the pain is more of a sharp pain. Patient is able to ambulate without any trouble but states that she stretches to strong she has pain. Patient denies any radiation of pain any numbness or tingling. Patient states though that the medial gastroc head that was hurting previously is well healed at this time. Patient is not complaining of any pain or swelling. Patient has been wearing the compression sleeve a regular basis and thinks this has helped. Patient has avoided high impact exercises at this time.  Patient is also complaining of right thumb pain somewhat. Patient states that this has been a constant thing. Patient finds it very difficult to open up hands or using her keeping the door from time to time. Patient denies any elbow pain. Patient describes the pain more as a dull aching sensation that is not stopping her from any of her regular activities and denies any type of weakness.  Physical exam Pulse 64, height 5\' 3"  (1.6 m), weight 111 lb (50.349 kg). General: No apparent distress alert and oriented x3 Exam: Patient does not have any swelling. She has no tenderness over the left medial gastroc head where the tear occurred previously. Patient's insertion of the lateral gastroc head is non tender but the soleus a few cms blow is tender to palpation but there is no inflammation or ecchymosis. Patient has full strength and range of motion of the ankle  distally. Patient ambulates without significant Limp. Patient's right thumb exam: Patient does have a positive Finkelstein's. She is nontender to palpation. Patient has full range of motion with no crepitus of the [facility] joint.  MSK U/S patient's ultrasound shows that she has near full healing of the medial gastroc tear seen previously. Patient has very minimal residual of the hematoma seen previously and some mild scar tissue but at this point seems to be healing just fine. Patient though does show that on the lateral aspect of her soleus she does have some hypoechoic changes distal to the insertion of the gastroc. Patient's gastrocnemius on the lateral aspect though does not show any true tear. There is no significant neovascularization but does have some mild generalized neovascularization.

## 2013-01-21 NOTE — Assessment & Plan Note (Signed)
Patient does have a mild tenosynovitis occurring of the right thumb. Patient was given a night splint today and to wear only with sleep. Patient also is going to take meloxicam which likely will help. Patient to followup if we do not notice much improvement in 4 weeks and we'll consider a corticosteroid injection.

## 2013-01-31 ENCOUNTER — Emergency Department
Admission: EM | Admit: 2013-01-31 | Discharge: 2013-01-31 | Disposition: A | Discharge status: Home / Self Care | Payer: 59 | Source: Home / Self Care | Attending: Family Medicine | Admitting: Family Medicine

## 2013-01-31 DIAGNOSIS — M79609 Pain in unspecified limb: Secondary | ICD-10-CM

## 2013-01-31 DIAGNOSIS — L600 Ingrowing nail: Secondary | ICD-10-CM

## 2013-01-31 NOTE — ED Provider Notes (Signed)
History     CSN: [id number]  Arrival date & time 01/31/13  1426   First MD Initiated Contact with Patient 01/31/13 1456      Chief Complaint  Patient presents with  . Toe Pain   HPI Patient presents today with intermittent right great toe pain for the past 2 months. Patient was having episodes of intermittent swelling and redness that have improved with Epson salt soaks. Has had some peripheral redness that has seemed to recur over this time frame. Mild swelling. Although no drainage. Patient denies any recent traumas. No recent antibiotic use. Patient is nondiabetic. Over the course of the past 2-3 days distal right great toe has progressively gotten more red and inflamed. Minimal pain with weightbearing. No numbness. No hx/o gout.   Past Medical History  Diagnosis Date  . Hx of colonic polyps   . Iliotibial band syndrome of right side 09/2011  . Talipes cavus 02/2010  . At risk for decreased bone density     DEXA 03/2011 normal (-1.0 at R fem)    Past Surgical History  Procedure Laterality Date  . No past surgeries      Family History  Problem Relation Age of Onset  . Prostate cancer Father   . Dementia Father   . Breast cancer Paternal Grandmother   . Osteopenia Mother     History  Substance Use Topics  . Smoking status: Never Smoker   . 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 Alcohol Use: 2.5 oz/week    5 drink(s) per week    OB History   Grav Para Term Preterm Abortions TAB SAB Ect Mult Living                  Review of Systems  All other systems reviewed and are negative.    Allergies  Codeine  Home Medications   Current Outpatient Rx  Name  Route  Sig  Dispense  Refill  . amoxicillin (AMOXIL) 875 MG tablet   Oral   Take 1 tablet (875 mg total) by mouth 2 (two) times daily.   20 tablet   0   . Calcium Carbonate-Vit D-Min (CALCIUM 1200 PO)   Oral   Take by mouth daily.             Marland Kitchen estrogen, conjugated,-medroxyprogesterone (PREMPRO) 0.625-2.5 MG per tablet   Oral   Take 1 tablet by mouth daily.           Marland Kitchen EXPIRED: flurbiprofen (ANSAID) 100 MG tablet   Oral   Take 100 mg by mouth 2 (two) times daily as needed.         . meloxicam (MOBIC) 15 MG tablet   Oral   Take 1 tablet (15 mg total) by mouth daily.   30 tablet   2   . Multiple Vitamin (MULTIVITAMIN) tablet   Oral   Take 1 tablet by mouth daily.           . sertraline (ZOLOFT) 100 MG tablet   Oral   Take 0.5 tablets (50 mg total) by mouth daily. As directed         . sertraline (ZOLOFT) 25 MG tablet   Oral   Take 1 tablet (25 mg total) by mouth daily. As directed   90 tablet   1     BP 119/75  Pulse 75  Temp(Src) 97.8 F (36.6 C) (Oral)  Ht 5'  3" (1.6 m)  Wt 113 lb (51.256 kg)  BMI 20.02 kg/m2  SpO2 99%  Physical Exam  Constitutional: She appears well-developed and well-nourished.  HENT:  Head: Normocephalic and atraumatic.  Eyes: Conjunctivae are normal. Pupils are equal, round, and reactive to light.  Neck: Normal range of motion. Neck supple.  Cardiovascular: Normal rate and regular rhythm.   Pulmonary/Chest: Effort normal.  Abdominal: Soft.  Musculoskeletal:       Feet:  Neurological: She is alert.  Skin: Skin is warm.    ED Course  Procedures (including critical care time)  Labs Reviewed - No data to display No results found.   1. Ingrown toenail       MDM  Mild to moderate case of ingrown toenail today. Had a relatively lengthy discussion with patient today in terms of treatment options. Will proceed with fairly conservative measures today which include continued Epson salt soaks, cotton swab nail bed elevation, as well as floss nail bed elevation. Will place on Augmentin for soft tissue coverage as there is some mild infectious changes although with no purulent drainage today. Discussed with patient if symptoms fail to improve with conservative measures a  partial nail removal may be clinically indicated. Discussed general infectious red flags with patient. Followup as needed.    The patient and/or caregiver has been counseled thoroughly with regard to treatment plan and/or medications prescribed including dosage, schedule, interactions, rationale for use, and possible side effects and they verbalize understanding. Diagnoses and expected course of recovery discussed and will return if not improved as expected or if the condition worsens. Patient and/or caregiver verbalized understanding.              Doree Albee, MD 01/31/13 [phone]

## 2013-01-31 NOTE — ED Notes (Signed)
States right great toe has been swollen on and off for two months, without drainage but painful to touch.

## 2013-03-11 ENCOUNTER — Other Ambulatory Visit: Payer: Self-pay

## 2013-03-11 DIAGNOSIS — Z1231 Encounter for screening mammogram for malignant neoplasm of breast: Secondary | ICD-10-CM

## 2013-04-03 ENCOUNTER — Ambulatory Visit
Admission: RE | Admit: 2013-04-03 | Discharge: 2013-04-03 | Disposition: A | Discharge status: Home / Self Care | Payer: 59 | Source: Ambulatory Visit

## 2013-04-03 DIAGNOSIS — Z1231 Encounter for screening mammogram for malignant neoplasm of breast: Secondary | ICD-10-CM

## 2013-04-16 ENCOUNTER — Ambulatory Visit: Payer: 59

## 2013-10-01 ENCOUNTER — Other Ambulatory Visit: Payer: Self-pay

## 2013-10-29 ENCOUNTER — Encounter: Payer: Self-pay | Admitting: Gynecology

## 2013-10-30 ENCOUNTER — Ambulatory Visit: Payer: 59 | Admitting: Obstetrics and Gynecology

## 2013-10-30 ENCOUNTER — Ambulatory Visit (INDEPENDENT_AMBULATORY_CARE_PROVIDER_SITE_OTHER): Payer: 59 | Admitting: Gynecology

## 2013-10-30 ENCOUNTER — Ambulatory Visit: Payer: 59 | Admitting: Gynecology

## 2013-10-30 ENCOUNTER — Encounter: Payer: Self-pay | Admitting: Gynecology

## 2013-10-30 VITALS — BP 120/80 | HR 66 | Resp 16 | Ht 63.0 in | Wt 112.0 lb

## 2013-10-30 DIAGNOSIS — Z79890 Hormone replacement therapy (postmenopausal): Secondary | ICD-10-CM

## 2013-10-30 DIAGNOSIS — F32A Depression, unspecified: Secondary | ICD-10-CM

## 2013-10-30 DIAGNOSIS — Z01419 Encounter for gynecological examination (general) (routine) without abnormal findings: Secondary | ICD-10-CM

## 2013-10-30 DIAGNOSIS — F329 Major depressive disorder, single episode, unspecified: Secondary | ICD-10-CM

## 2013-10-30 MED ORDER — SERTRALINE HCL 100 MG PO TABS: 50.0000 mg | ORAL_TABLET | Freq: Every day | ORAL | Status: DC

## 2013-10-30 MED ORDER — CONJ ESTROG-MEDROXYPROGEST ACE 0.625-2.5 MG PO TABS: 1.0000 | ORAL_TABLET | Freq: Every day | ORAL | Status: DC

## 2013-10-30 NOTE — Patient Instructions (Signed)

## 2013-10-30 NOTE — Progress Notes (Signed)
57 y.o. Married Caucasian female   [id number] here for annual exam. Pt reports menses are regular.  She does not report hot flashes, does not have night sweats, does not have vaginal dryness.  She is using lubricants, Prempro.  She does report post-menopausal spotting.  Pt had been on prempro for 3y but still has intermittent spotting every 4w about 1-2d like a menses.    No LMP recorded. Patient is postmenopausal.          Sexually active: no  The current method of family planning is post menopausal status.    Exercising: yes  jazzercise 5-7x/wk Last pap: 10/17/12 NEG HR HPV Abnormal PAP: No Mammogram: 03/2013  BSE: no Colonoscopy:  10/2009 f/u in 10 years DEXA:  2013 Alcohol: 5 drinks/wk Tobacco: no  Hgb: PCP ; Urine: PCP  Health Maintenance  Topic Date Due  . Influenza Vaccine  06/26/2013  . Pap Smear  09/19/2013  . Mammogram  04/04/2015  . Tetanus/tdap  10/21/2019  . Colonoscopy  11/04/2019    Family History  Problem Relation Age of Onset  . Prostate cancer Father   . Dementia Father   . Breast cancer Paternal Grandmother   . Osteopenia Mother     Patient Active Problem List   Diagnosis Date Noted  . Tenosynovitis, de Quervain 01/21/2013  . Calf pain 12/02/2012  . Situational mixed anxiety and depressive disorder 04/10/2012  . Iliotibial band syndrome of right side 10/09/2011  . TALIPES CAVUS 03/23/2010    Past Medical History  Diagnosis Date  . Hx of colonic polyps   . Iliotibial band syndrome of right side 09/2011  . Talipes cavus 02/2010  . At risk for decreased bone density     DEXA 03/2011 normal (-1.0 at R fem)  . Depression     reactive  . Breast nodule 04/1996    left    Past Surgical History  Procedure Laterality Date  . No past surgeries      Allergies: Codeine  Current Outpatient Prescriptions  Medication Sig Dispense Refill  . amoxicillin (AMOXIL) 875 MG tablet Take 1 tablet (875 mg total) by mouth 2 (two) times daily.  20 tablet  0  .  Calcium Carbonate-Vit D-Min (CALCIUM 1200 PO) Take by mouth daily.        Marland Kitchen estrogen, conjugated,-medroxyprogesterone (PREMPRO) 0.625-2.5 MG per tablet Take 1 tablet by mouth daily.        . flurbiprofen (ANSAID) 100 MG tablet Take 100 mg by mouth 2 (two) times daily as needed.      . meloxicam (MOBIC) 15 MG tablet Take 1 tablet (15 mg total) by mouth daily.  30 tablet  2  . Multiple Vitamin (MULTIVITAMIN) tablet Take 1 tablet by mouth daily.        . sertraline (ZOLOFT) 100 MG tablet Take 0.5 tablets (50 mg total) by mouth daily. As directed      . sertraline (ZOLOFT) 25 MG tablet Take 1 tablet (25 mg total) by mouth daily. As directed  90 tablet  1   No current facility-administered medications for this visit.    ROS: Pertinent items are noted in HPI.  Exam:    There were no vitals taken for this visit. Weight change: @WEIGHTCHANGE @ Last 3 height recordings:  Ht Readings from Last 3 Encounters:  01/31/13 5\' 3"  (1.6 m)  01/21/13 5\' 3"  (1.6 m)  01/01/13 5\' 3"  (1.6 m)   General appearance: alert, cooperative and appears stated age Head: Normocephalic, without obvious  abnormality, atraumatic Neck: no adenopathy, no carotid bruit, no JVD, supple, symmetrical, trachea midline and thyroid not enlarged, symmetric, no tenderness/mass/nodules Lungs: clear to auscultation bilaterally Breasts: normal appearance, no masses or tenderness Heart: regular rate and rhythm, S1, S2 normal, no murmur, click, rub or gallop Abdomen: soft, non-tender; bowel sounds normal; no masses,  no organomegaly Extremities: extremities normal, atraumatic, no cyanosis or edema Skin: Skin color, texture, turgor normal. No rashes or lesions Lymph nodes: Cervical, supraclavicular, and axillary nodes normal. no inguinal nodes palpated Neurologic: Grossly normal   Pelvic: External genitalia:  no lesions              Urethra: normal appearing urethra with no masses, tenderness or lesions              Bartholins and  Skenes: normal                 Vagina: normal appearing vagina with normal color and discharge, no lesions              Cervix: normal appearance              Pap taken: no        Bimanual Exam:  Uterus:  uterus is normal size, shape, consistency and nontender                                      Adnexa:    normal adnexa in size, nontender and no masses                                      Rectovaginal: Confirms                                      Anus:  normal sphincter tone, no lesions  A: well woman no contraindication to continue hormonal therapy      P: mammogram annual pap smear not done Discussed risks and benefits of HRT, reviewed WHI findings, watch bleeding, will chart and call for change, can consider change in dose counseled on breast self exam, mammography screening, menopause, adequate intake of calcium and vitamin D, diet and exercise return annually or prn Discussed PAP guideline changes, importance of weight bearing exercises, calcium, vit D and balanced diet.  An After Visit Summary was printed and given to the patient.

## 2014-03-29 ENCOUNTER — Other Ambulatory Visit: Payer: Self-pay | Admitting: Gynecology

## 2014-03-29 DIAGNOSIS — Z1231 Encounter for screening mammogram for malignant neoplasm of breast: Secondary | ICD-10-CM

## 2014-04-07 ENCOUNTER — Ambulatory Visit (INDEPENDENT_AMBULATORY_CARE_PROVIDER_SITE_OTHER): Payer: 59 | Admitting: Sports Medicine

## 2014-04-07 ENCOUNTER — Encounter: Payer: Self-pay | Admitting: Sports Medicine

## 2014-04-07 VITALS — BP 133/84 | Ht 63.0 in | Wt 111.0 lb

## 2014-04-07 DIAGNOSIS — S86819A Strain of other muscle(s) and tendon(s) at lower leg level, unspecified leg, initial encounter: Secondary | ICD-10-CM

## 2014-04-07 DIAGNOSIS — S838X9A Sprain of other specified parts of unspecified knee, initial encounter: Secondary | ICD-10-CM

## 2014-04-07 DIAGNOSIS — S86119A Strain of other muscle(s) and tendon(s) of posterior muscle group at lower leg level, unspecified leg, initial encounter: Secondary | ICD-10-CM

## 2014-04-07 MED ORDER — MELOXICAM 15 MG PO TABS: 15.0000 mg | ORAL_TABLET | Freq: Every day | ORAL | Status: DC

## 2014-04-07 NOTE — Progress Notes (Signed)
CC: Left calf reinjury HPI: Laura Munoz returns for followup today. Unfortunately she reinjured her left calf on May 1. Recall that she had a severe calf strain of her left medial gastroc back in January 2014. This was treated with a calf sleeve and rehabilitation and gradually she improved. She has returned to her normal preinjury fitness level and was doing quite well until May 1. She was doing a bridging exercise with all of her weight on her left leg and she felt a pop and sudden pain in her left medial gastroc. This is about the same place she felt the initial injury. She has been treating this with ice, stretching. However she still notes that there is a throbbing type of pain. She just wanted to come in and have it checked out.  ROS: As above in the HPI. All other systems are stable or negative.  OBJECTIVE: APPEARANCE:  Patient in no acute distress.The patient appeared well nourished and normally developed. HEENT: No scleral icterus. Conjunctiva non-injected Resp: Non labored Skin: No rash MSK:  Left Calf Exam  - No swelling - Mild tenderness at the mid portion of the medial gastroc - Normal 5 out of 5 strength on foot plantar and dorsi flexion - She is able to perform a single-leg heel raise on the left leg  MSK [place]: Limited ultrasound of the left medial gastroc was performed today in transverse and longitudinal views. There is a small area in the medial gastroc where there is some fiber disarray consistent with previous scar tissue from injury. I do not see any evidence of new tearing of fibers or any hypoechoic change within the muscle to suggest bleeding.   ASSESSMENT: #1. Recurrent left calf strain   PLAN: Discussed treatment plan with the patient. I did go over with her that scar tissue is less resilient and less compliant and so the risk of reinjury is much higher. We will skip have her continue the calf sleeve. She was given some heel lifts. We will have her continue doing her  rehabilitation exercises. We expect that this will get better in the next 4-6 weeks. She may gradually increase her activity. We will see her back as needed.

## 2014-04-07 NOTE — Patient Instructions (Signed)
Thank you for coming in today  1. Continue calf sleeve during exercise 2. Continue ice  3. Continue rehab exercises 4. Heel lift in flat shoes  Followup as needed

## 2014-04-08 ENCOUNTER — Ambulatory Visit (HOSPITAL_COMMUNITY)
Admission: RE | Admit: 2014-04-08 | Discharge: 2014-04-08 | Disposition: A | Discharge status: Home / Self Care | Payer: 59 | Source: Ambulatory Visit | Attending: Gynecology | Admitting: Gynecology

## 2014-04-08 DIAGNOSIS — Z1231 Encounter for screening mammogram for malignant neoplasm of breast: Secondary | ICD-10-CM | POA: Insufficient documentation

## 2014-04-28 ENCOUNTER — Ambulatory Visit: Payer: 59 | Admitting: Sports Medicine

## 2014-06-08 ENCOUNTER — Encounter: Payer: Self-pay | Admitting: Sports Medicine

## 2014-06-08 ENCOUNTER — Ambulatory Visit (INDEPENDENT_AMBULATORY_CARE_PROVIDER_SITE_OTHER): Payer: 59 | Admitting: Sports Medicine

## 2014-06-08 VITALS — BP 130/80 | HR 78 | Ht 63.0 in | Wt 110.0 lb

## 2014-06-08 DIAGNOSIS — M79609 Pain in unspecified limb: Secondary | ICD-10-CM

## 2014-06-08 DIAGNOSIS — M79662 Pain in left lower leg: Secondary | ICD-10-CM

## 2014-06-08 DIAGNOSIS — M629 Disorder of muscle, unspecified: Secondary | ICD-10-CM

## 2014-06-08 DIAGNOSIS — M7631 Iliotibial band syndrome, right leg: Secondary | ICD-10-CM

## 2014-06-08 NOTE — Progress Notes (Signed)
Patient ID: Laura Munoz, female   DOB: 1956/10/27, 58 y.o.   MRN: [id number]  Med Gastroc in 1/7/ 14 16 mos later had a leg injury higher in leg/or in the proximal gastrocnemius  Injury was with leg elevated on ball and doing HS exercise  We treated with calf protocol past 9 weeks Some better but still feel burning more anterior and up into medial knee  Able to do exercise with Jazzercise Hurts after but not during  Morning gets burning/ sleeps on back  No weakness or significant pain except following exercise She has continued with her exercise program  Examination Thin athletic female in no acute distress BP 130/80  Pulse 78  Ht 5\' 3"  (1.6 m)  Wt 110 lb (49.896 kg)  BMI 19.49 kg/m2  LMP 10/23/2013  Hips bilaterally reveal full range of motion Excellent abduction and flexion strength of the hips Excellent quadriceps strength Calf development is good and there is no tenderness even in the left medial gastroc  Hamstrings bilaterally show weakness and this brings up some symptoms with testing on the left Some pain radiates to the left  pes anserine bursa  Knee: Normal to inspection with no erythema or effusion or obvious bony abnormalities. Palpation normal with no warmth or joint line tenderness or patellar tenderness or condyle tenderness. ROM normal in flexion and extension and lower leg rotation. Ligaments with solid consistent endpoints including ACL, PCL, LCL, MCL. Negative Mcmurray's and provocative meniscal tests. Non painful patellar compression. Patellar and quadriceps tendons unremarkable. Hamstring and quadriceps strength is normal.

## 2014-06-08 NOTE — Assessment & Plan Note (Signed)
She still seems to be having some symptoms with her right iliotibial band even though her strength is excellent  I suggested restarting a series of stretches for the iliotibial band

## 2014-06-08 NOTE — Assessment & Plan Note (Signed)
It appears the pain this time is primarily the calf but more in the hamstring at the area where the gastrocnemius and hamstring tendons intersect  I suspect she has a degree of muscle imbalance This is suggested by hamstring weakness  Start with a series of hamstring exercises Keep up calf exercises and compression  Add some vitamin B6 50 mg twice daily since the burning could be superficial nerve irritation  Recheck in 6 weeks but if not improved I would like to repeat the ultrasound

## 2014-06-08 NOTE — Patient Instructions (Signed)
Use aleve if needed  But start vitamin B6 50 mgm twice daily  Calf sleeve is good  See if this does not improve over 6 week period  ITB stretches  See me if not better

## 2014-06-29 ENCOUNTER — Ambulatory Visit: Payer: 59 | Admitting: Sports Medicine

## 2014-07-20 ENCOUNTER — Ambulatory Visit: Payer: 59 | Admitting: Sports Medicine

## 2014-08-03 ENCOUNTER — Encounter: Payer: Self-pay | Admitting: Sports Medicine

## 2014-08-03 ENCOUNTER — Ambulatory Visit (INDEPENDENT_AMBULATORY_CARE_PROVIDER_SITE_OTHER): Payer: 59 | Admitting: Sports Medicine

## 2014-08-03 ENCOUNTER — Ambulatory Visit
Admission: RE | Admit: 2014-08-03 | Discharge: 2014-08-03 | Disposition: A | Discharge status: Home / Self Care | Payer: 59 | Source: Ambulatory Visit | Attending: Sports Medicine | Admitting: Sports Medicine

## 2014-08-03 VITALS — BP 137/80 | Ht 63.0 in | Wt 110.0 lb

## 2014-08-03 DIAGNOSIS — M79609 Pain in unspecified limb: Secondary | ICD-10-CM

## 2014-08-03 DIAGNOSIS — M545 Low back pain, unspecified: Secondary | ICD-10-CM | POA: Insufficient documentation

## 2014-08-03 DIAGNOSIS — M79662 Pain in left lower leg: Secondary | ICD-10-CM

## 2014-08-03 DIAGNOSIS — M79605 Pain in left leg: Secondary | ICD-10-CM

## 2014-08-03 MED ORDER — GABAPENTIN 300 MG PO CAPS: 300.0000 mg | ORAL_CAPSULE | Freq: Every day | ORAL | Status: DC

## 2014-08-03 NOTE — Assessment & Plan Note (Signed)
Hx of injury in past but I am concerned this is radicular from LB with negative exam and [place]  Keep up baseline exercise

## 2014-08-03 NOTE — Progress Notes (Signed)
   Subjective:    Patient ID: Laura Munoz, female    DOB: Oct 15, 1956, 58 y.o.   MRN: [id number]  HPI 58 year old female with history of medial gastrocnemius tear in 12/02/2012; status-post resolution and subsequent injury to higher, proximal portion of gastrocnemius in 03/26/2013.  She presents for follow-up to 06/08/2014 visit where she had c/o persistent burning type pain superiorly from med gastrocnemius to med and lat knee. Exacerbated by bending over. Had been prescribed B6 for suspected neuropathic involvement and given hamstring strengthening exercises. She has been compliant with both and symptoms have been somewhat mitigated but still persist. No weakness, no injury, no change in activity.  Review of Systems Long hx of LBP/  MRI showed DDD 20 years ago/ does daily stretches to lessen these sxs    Objective:   Physical Exam Filed Vitals:   08/03/14 1348 08/03/14 1354  BP: 137/80   Height:  5\' 3"  (1.6 m)  Weight:  110 lb (49.896 kg)   Ortho Exam Hips: No abnormality on gross inspection; full range of motion bilaterally; abduction and flexion strength of the hips fully intact and symmetrical; quadriceps strength fully intact and symmetrical;  Hamstrings: No abnormality on gross inspection; symmetric and full strength Knee: No abnormality on gross inspection; Palpation normal with no warmth or joint line tenderness appreciated; ROM normal in flexion and extension and lower leg rotation; Negative Mcmurray's and provocative meniscal tests; patellar and quadriceps tendons nontender Calves: No abnormality on gross inspection; development is good and there is trace/mild tenderness in the left medial gastrocnemius   Neg SLR Neg H test  Diagnostic Ultrasound Evalution: [employer] E, MSK ultrasound, MSK probe Anatomy scanned: Left gastrocnemius, left knee, popliteal fossa Indication: Pain Findings: no tenidopathy, no recurrence of muscle tear, no significant abnormalities  noted Sciatic nerve visualized and normal appearance     Assessment & Plan:   Diagnoses and associated orders for this visit:  Calf pain, left - Concern for neuropathic etiology as no discreet MSK abnormality seen on ultrasound and burning pain on history in line with neuro. B6 has helped. Will continue with conservative pharmacotherapy by adding gabapentin 300mg  QHS x 1 month and monitor response.; Will obtain plain films lumbar back to evaluate for central process as patient has history of back pain from age 4s - DG Lumbar Spine 2-3 Views; Future  Other Orders - gabapentin (NEURONTIN) 300 MG capsule; Take 1 capsule (300 mg total) by mouth at bedtime.   Rosette Reveal, MD Conni Elliot FMR PGY-3  Agree with assessment and edited/  Ila Mcgill, MD

## 2014-08-03 NOTE — Assessment & Plan Note (Signed)
Start Gabapentin  Ck LS spine films: These show DDD at L5/S1 as well as L2/L3/ facet joint mild DJD as well  These are likely the source of neural compression

## 2014-09-08 ENCOUNTER — Encounter: Payer: Self-pay | Admitting: Sports Medicine

## 2014-09-08 ENCOUNTER — Ambulatory Visit (INDEPENDENT_AMBULATORY_CARE_PROVIDER_SITE_OTHER): Payer: 59 | Admitting: Sports Medicine

## 2014-09-08 VITALS — BP 117/79 | HR 64 | Ht 63.0 in | Wt 110.0 lb

## 2014-09-08 DIAGNOSIS — M79662 Pain in left lower leg: Secondary | ICD-10-CM

## 2014-09-08 NOTE — Assessment & Plan Note (Signed)
-  She will try taking gabapentin 300 mg nightly for the next 7 days to see if she can tolerate this. If she can tolerate it with minimal side effects, she will continue this. -If she has side effects that are intolerable, she will purchase over-the-counter capsaicin cream to try for the pain. -She'll followup if needed.

## 2014-09-08 NOTE — Progress Notes (Signed)
   Subjective:    Patient ID: Laura Munoz, female    DOB: January 23, 1956, 58 y.o.   MRN: [id number]  HPI Laura Munoz is a 58yo female who presents for followup of left calf pain. Since we last saw her she says that her symptoms are overall unchanged. When she was seen in September, ultrasound was suggestive of a fully healed calf strain. She has a history of chronic low back pain since her early 33s, which is overall unchanged. Radiographs were performed of the lumbar spine and significant for L5-S1 and L2-L3 disc space narrowing with facet hypertrophy. She denies any feeling that her back pain is radiating to her lower extremities, and does not think that the 2 of these are linked. She has been diligently during her course strengthening and stretching exercises. She localizes her pain to a discrete region of the medial left calf, characterized by a burning and tingling sensation. She says that this occurs intermittently and is not necessarily brought on with any activity except perhaps standing. She admits that she did not take the gabapentin as previously prescribed 22 concerns over significant side effects, saying she is very sensitive to medications. She will occasionally take Aleve. She has not tried anything topically for this. She denies any other weakness, fevers, or chills.  Past medical history, social history, medications, and allergies were reviewed and are up to date in the chart.  Review of Systems 7 point review of systems was performed and was otherwise negative unless noted in the history of present illness.    Objective:   Physical Exam BP 117/79  Pulse 64  Ht 5\' 3"  (1.6 m)  Wt 110 lb (49.896 kg)  BMI 19.49 kg/m2  LMP 10/23/2013 GEN: The patient is well-developed well-nourished female and in no acute distress.  She is awake alert and oriented x3. SKIN: warm and well-perfused, no rash  EXTR: No lower extremity edema or calf tenderness Neuro: Strength 5/5 globally. Sensation  intact throughout. DTRs 2/4 bilaterally. No focal deficits. Vasc: +2 bilateral distal pulses. No edema.  MSK: Examination of the bilateral lower extremities the heels fully intact calf strength. She has no calf atrophy. She has intact distal sensation. She has equal and symmetric Achilles tendon reflexes with no clonus. Lumbar spine early full pain-free range of motion. She has no obvious lateral curve or scoliosis. She has good core strength. She has equal leg lengths.     Assessment & Plan:  Please see problem based assessment and plan in the problem list.

## 2014-10-26 ENCOUNTER — Other Ambulatory Visit: Payer: Self-pay | Admitting: Certified Nurse Midwife

## 2014-10-26 DIAGNOSIS — Z79890 Hormone replacement therapy (postmenopausal): Secondary | ICD-10-CM

## 2014-10-26 MED ORDER — CONJ ESTROG-MEDROXYPROGEST ACE 0.625-2.5 MG PO TABS: 1.0000 | ORAL_TABLET | Freq: Every day | ORAL | Status: DC

## 2014-10-26 NOTE — Telephone Encounter (Signed)
Last AEX and refill 10/30/13 #90/ 3 refills Next appt 12/06/14  MMG TOMO Bilateral 04/10/14 BIRADS1: Neg  Rx sent to last until appt

## 2014-10-26 NOTE — Telephone Encounter (Signed)
Patient request refill of estrogen, conjugated,-medroxyprogesterone (PREMPRO) 0.625-2.5 MG per tablet. Pharmacy on file.

## 2014-11-05 ENCOUNTER — Ambulatory Visit: Payer: 59 | Admitting: Gynecology

## 2014-12-06 ENCOUNTER — Ambulatory Visit: Payer: 59 | Admitting: Certified Nurse Midwife

## 2014-12-28 ENCOUNTER — Ambulatory Visit (INDEPENDENT_AMBULATORY_CARE_PROVIDER_SITE_OTHER): Payer: 59 | Admitting: Certified Nurse Midwife

## 2014-12-28 ENCOUNTER — Encounter: Payer: Self-pay | Admitting: Certified Nurse Midwife

## 2014-12-28 VITALS — BP 120/74 | HR 72 | Resp 16 | Ht 62.5 in | Wt 111.0 lb

## 2014-12-28 DIAGNOSIS — F329 Major depressive disorder, single episode, unspecified: Secondary | ICD-10-CM

## 2014-12-28 DIAGNOSIS — F32A Depression, unspecified: Secondary | ICD-10-CM

## 2014-12-28 DIAGNOSIS — Z124 Encounter for screening for malignant neoplasm of cervix: Secondary | ICD-10-CM

## 2014-12-28 DIAGNOSIS — Z0000 Encounter for general adult medical examination without abnormal findings: Secondary | ICD-10-CM

## 2014-12-28 DIAGNOSIS — Z01419 Encounter for gynecological examination (general) (routine) without abnormal findings: Secondary | ICD-10-CM

## 2014-12-28 DIAGNOSIS — Z79890 Hormone replacement therapy (postmenopausal): Secondary | ICD-10-CM

## 2014-12-28 LAB — POCT URINALYSIS DIPSTICK
BILIRUBIN UA: NEGATIVE
Blood, UA: NEGATIVE
GLUCOSE UA: NEGATIVE
KETONES UA: NEGATIVE
LEUKOCYTES UA: NEGATIVE
Nitrite, UA: NEGATIVE
PROTEIN UA: NEGATIVE
UROBILINOGEN UA: NEGATIVE
pH, UA: 5

## 2014-12-28 LAB — CBC
HCT: 38.6 % (ref 36.0–46.0)
Hemoglobin: 12.9 g/dL (ref 12.0–15.0)
MCH: 29.9 pg (ref 26.0–34.0)
MCHC: 33.4 g/dL (ref 30.0–36.0)
MCV: 89.6 fL (ref 78.0–100.0)
MPV: 9.3 fL (ref 8.6–12.4)
Platelets: 321 10*3/uL (ref 150–400)
RBC: 4.31 MIL/uL (ref 3.87–5.11)
RDW: 13.2 % (ref 11.5–15.5)
WBC: 4.9 10*3/uL (ref 4.0–10.5)

## 2014-12-28 LAB — COMPREHENSIVE METABOLIC PANEL
ALK PHOS: 25 U/L — AB (ref 39–117)
ALT: 23 U/L (ref 0–35)
AST: 26 U/L (ref 0–37)
Albumin: 3.8 g/dL (ref 3.5–5.2)
BILIRUBIN TOTAL: 0.4 mg/dL (ref 0.2–1.2)
BUN: 22 mg/dL (ref 6–23)
CALCIUM: 9.5 mg/dL (ref 8.4–10.5)
CO2: 29 mEq/L (ref 19–32)
Chloride: 102 mEq/L (ref 96–112)
Creat: 0.76 mg/dL (ref 0.50–1.10)
Glucose, Bld: 82 mg/dL (ref 70–99)
Potassium: 4.2 mEq/L (ref 3.5–5.3)
Sodium: 138 mEq/L (ref 135–145)
Total Protein: 7 g/dL (ref 6.0–8.3)

## 2014-12-28 LAB — LIPID PANEL
Cholesterol: 186 mg/dL (ref 0–200)
HDL: 76 mg/dL (ref >39)
LDL Cholesterol: 87 mg/dL (ref 0–99)
TRIGLYCERIDES: 115 mg/dL (ref <150)
Total CHOL/HDL Ratio: 2.4 Ratio
VLDL: 23 mg/dL (ref 0–40)

## 2014-12-28 MED ORDER — SERTRALINE HCL 100 MG PO TABS
50.0000 mg | ORAL_TABLET | ORAL | Status: DC
Start: 2014-12-28 — End: 2016-01-09

## 2014-12-28 MED ORDER — CONJ ESTROG-MEDROXYPROGEST ACE 0.625-2.5 MG PO TABS: 1.0000 | ORAL_TABLET | Freq: Every day | ORAL | Status: DC

## 2014-12-28 NOTE — Patient Instructions (Signed)

## 2014-12-28 NOTE — Progress Notes (Signed)
58 y.o. G0P0000 Married  Caucasian Fe here for annual exam. Menopausal on Prempro and continues to have spotting random with bright red bleeding and some brown discharge at the end. This has been happening for the past 4 years. Sees PCP prn. Desires screening labs today. No other health issues.  LMP 12/17/14 to 12/19/14       Sexually active: No.  The current method of family planning is post menopausal status.    Exercising: Yes.    jazzercise Smoker:  no  Health Maintenance: Pap:  10-17-12 neg HPV HR neg MMG: 04-08-14 category c density,birads 1:neg recommended 3D yearly Colonoscopy:  2010 f/u 18yrs BMD:   2012 TDaP:  2010 Labs: Poct urine-neg, hgb-12.6 Self breast exam:not done   reports that she has never smoked. She has never used smokeless tobacco. She reports that she drinks about 1.8 - 2.4 oz of alcohol per week. She reports that she does not use illicit drugs.  Past Medical History  Diagnosis Date  . Hx of colonic polyps   . Iliotibial band syndrome of right side 09/2011  . Talipes cavus 02/2010  . At risk for decreased bone density     DEXA 03/2011 normal (-1.0 at R fem)  . Depression     reactive  . Breast nodule 04/1996    left    Past Surgical History  Procedure Laterality Date  . No past surgeries      Current Outpatient Prescriptions  Medication Sig Dispense Refill  . Calcium Carbonate-Vit D-Min (CALCIUM 1200 PO) Take by mouth daily.      Marland Kitchen estrogen, conjugated,-medroxyprogesterone (PREMPRO) 0.625-2.5 MG per tablet Take 1 tablet by mouth daily. 90 tablet 0  . Multiple Vitamin (MULTIVITAMIN) tablet Take 1 tablet by mouth daily.      . Naproxen Sodium (ALEVE PO) Take by mouth as needed.    . sertraline (ZOLOFT) 100 MG tablet Take 0.5 tablets (50 mg total) by mouth daily. As directed (Patient taking differently: Take 50 mg by mouth every other day. As directed) 90 tablet 3   No current facility-administered medications for this visit.    Family History  Problem  Relation Age of Onset  . Prostate cancer Father   . Dementia Father   . Breast cancer Paternal Grandmother   . Osteopenia Mother     ROS:  Pertinent items are noted in HPI.  Otherwise, a comprehensive ROS was negative.  Exam:   BP 120/74 mmHg  Pulse 72  Resp 16  Ht 5' 2.5" (1.588 m)  Wt 111 lb (50.349 kg)  BMI 19.97 kg/m2  LMP 10/23/2013 Height: 5' 2.5" (158.8 cm) Ht Readings from Last 3 Encounters:  12/28/14 5' 2.5" (1.588 m)  09/08/14 5\' 3"  (1.6 m)  08/03/14 5\' 3"  (1.6 m)    General appearance: alert, cooperative and appears stated age Head: Normocephalic, without obvious abnormality, atraumatic Neck: no adenopathy, supple, symmetrical, trachea midline and thyroid normal to inspection and palpation Lungs: clear to auscultation bilaterally Breasts: normal appearance, no masses or tenderness, No nipple retraction or dimpling, No nipple discharge or bleeding, No axillary or supraclavicular adenopathy Heart: regular rate and rhythm Abdomen: soft, non-tender; no masses,  no organomegaly Extremities: extremities normal, atraumatic, no cyanosis or edema Skin: Skin color, texture, turgor normal. No rashes or lesions Lymph nodes: Cervical, supraclavicular, and axillary nodes normal. No abnormal inguinal nodes palpated Neurologic: Grossly normal   Pelvic: External genitalia:  no lesions  Urethra:  normal appearing urethra with no masses, tenderness or lesions              Bartholin's and Skene's: normal                 Vagina: normal appearing vagina with normal color and discharge, no lesions              Cervix: normal, non tender, no lesions              Pap taken: Yes.   Bimanual Exam:  Uterus:  normal size, contour, position, consistency, mobility, non-tender              Adnexa: normal adnexa and no mass, fullness, tenderness               Rectovaginal: Confirms               Anus:  normal sphincter tone, no lesions    A:  Well Woman with normal  exam  Menopausal on HRT. Desires continuance.  ? PMB or just withdrawal bleeding with Prempro use  Screening labs today    P:   Reviewed health and wellness pertinent to exam  Discussed risks/benefits of HRT use and [facility] information on long term use after 60. Questions addressed. Patient feels well on and strives to stay healthy. Patient to monitor bleeding episodes on Prempro over the next month and advise.  Rx Prempro see order  Labs: CMP,Lipid panel, Vitamin D,TSH,CBC  Pap smear taken today with reflex   counseled on breast self exam, mammography screening, adequate intake of calcium and vitamin D, diet and exercise  return annually or prn  An After Visit Summary was printed and given to the patient.

## 2014-12-29 LAB — VITAMIN D 25 HYDROXY (VIT D DEFICIENCY, FRACTURES): VIT D 25 HYDROXY: 56 ng/mL (ref 30–100)

## 2014-12-29 LAB — HEMOGLOBIN, FINGERSTICK: HEMOGLOBIN, FINGERSTICK: 12.6 g/dL (ref 12.0–16.0)

## 2014-12-29 LAB — TSH: TSH: 4.448 u[IU]/mL (ref 0.350–4.500)

## 2014-12-29 NOTE — Progress Notes (Signed)
Due to age, recommend pt have PUS and endometrial biopsy. Reviewed personally.  Laura Munoz

## 2014-12-30 ENCOUNTER — Telehealth: Payer: Self-pay | Admitting: Emergency Medicine

## 2014-12-30 DIAGNOSIS — N950 Postmenopausal bleeding: Secondary | ICD-10-CM

## 2014-12-30 LAB — IPS PAP TEST WITH REFLEX TO HPV

## 2014-12-30 NOTE — Telephone Encounter (Signed)
Message left to return call to [place] at [phone].   Orders placed for Pelvic ultrasound and Endometrial biopsy

## 2014-12-30 NOTE — Telephone Encounter (Signed)
Spoke with patient. Message from Dr. Sabra Heck and Regina Eck CNM discussed. Patient agreeable to scheduling.   Brief description of procedures given. Patient given instructions. Requests instructions be sent via mychart as well so she can refer to them prior to appointment. Mychart message sent.  Patient will be out of town and requests office visit for 01/27/15.   Advised patient would be contacted with out of pocket costs by Scientist, clinical (histocompatibility and immunogenetics).   Routing to provider for final review. Patient agreeable to disposition. Will close encounter  Cc Regina Eck CNM.  Felipa Emory.

## 2014-12-30 NOTE — Telephone Encounter (Signed)
Revision History       Date/Time User Action    > 12/29/2014 2:50 PM Lyman Speller, MD Sign     12/28/2014 5:21 PM Regina Eck, CNM Sign at close encounter     12/28/2014 3:02 PM Danie Binder, CMA Sign at close encounter              Lyman Speller, MD at 12/29/2014 2:48 PM     Status: Signed       Expand All Collapse All   Due to age, recommend pt have PUS and endometrial biopsy. Reviewed personally. Fanny Skates, CNM at 12/30/2014 8:03 AM     Status: Signed       Expand All Collapse All   Please notify patient that discussed her continued spotting with Dr. Sabra Heck and agrees patient needs PUS and endometrial biopsy. Please schedule

## 2014-12-30 NOTE — Telephone Encounter (Deleted)
-----   Message from Regina Eck, CNM sent at 12/30/2014  8:04 AM EST -----   ----- Message -----    From: Lyman Speller, MD    Sent: 12/29/2014   2:48 PM      To: Regina Eck, CNM    ----- Message -----    From: Regina Eck, CNM    Sent: 12/28/2014   4:14 PM      To: Lyman Speller, MD

## 2014-12-30 NOTE — Progress Notes (Signed)
Please notify patient that discussed her continued spotting with Dr. Sabra Heck and agrees patient needs PUS and endometrial biopsy. Please schedule

## 2014-12-31 ENCOUNTER — Telehealth: Payer: Self-pay | Admitting: Obstetrics & Gynecology

## 2014-12-31 NOTE — Telephone Encounter (Signed)
Left message for patient to call back.  Need to go over benefits for

## 2014-12-31 NOTE — Telephone Encounter (Signed)
Left message for patient to call back.  Need to go over benefits for PUS. Pr $0

## 2014-12-31 NOTE — Telephone Encounter (Signed)
-----   Message from Elenore Paddy sent at 12/31/2014  2:14 PM EST ----- Pt left 2 voicemails during lunch hours returning your call.

## 2014-12-31 NOTE — Telephone Encounter (Signed)
Left message for patient to call back. Need to go over benefits for PUS. Pr $0

## 2015-01-03 ENCOUNTER — Telehealth: Payer: Self-pay | Admitting: Obstetrics & Gynecology

## 2015-01-03 NOTE — Telephone Encounter (Signed)
Returning a call to Sabrina. °

## 2015-01-04 NOTE — Telephone Encounter (Signed)
Call to patient. Advised that per benefit quote received, PUS will be covered at 100%. There will be 0 patient liability. Patient agreeable.

## 2015-01-25 ENCOUNTER — Telehealth: Payer: Self-pay | Admitting: Obstetrics & Gynecology

## 2015-01-25 NOTE — Telephone Encounter (Signed)
Pt would like to speak with Dr Sabra Heck prior to her ultrasound appointment on Thursday because she is wondering if the appointment is necessary.

## 2015-01-25 NOTE — Telephone Encounter (Signed)
Spoke with patient. Patient states "I am just concerned about having the ultrasound and biopsy because I saw Dr.Romine previously who told me that I may have spotting with the Prempro. Then I saw Dr.Lathrop and she told me that she did not feel it was abnormal and if it continued with any sort of pattern to let her know. It never did. It is very random like every three months and is more of a mucous red discharge." Advised patient that this appointment is highly recommend by Dr.Miller for further evaluation with the continued spotting she has been having. Advised this will allow [place] to see if there is anything further causing the spotting. Patient is agreeable. "I am more agreeable with the ultrasound and having the biopsy if I need it." Advised Dr.Miller will review the ultrasound and make recommendations at that point. If EMB is needed Dr.Miller will go over that with patient before performing that day. Patient is agreeable and verbalizes understanding. Will keep appointment for 3/3 at 1:30pm.  Dr.Miller, anything further for this patient?

## 2015-01-25 NOTE — Telephone Encounter (Signed)
No.  Agree with recommendations.  Thanks.  OK to close encounter.

## 2015-01-27 ENCOUNTER — Ambulatory Visit (INDEPENDENT_AMBULATORY_CARE_PROVIDER_SITE_OTHER): Payer: 59 | Admitting: Obstetrics & Gynecology

## 2015-01-27 ENCOUNTER — Ambulatory Visit (INDEPENDENT_AMBULATORY_CARE_PROVIDER_SITE_OTHER): Payer: 59

## 2015-01-27 VITALS — BP 124/80 | Ht 63.0 in | Wt 112.0 lb

## 2015-01-27 DIAGNOSIS — R935 Abnormal findings on diagnostic imaging of other abdominal regions, including retroperitoneum: Secondary | ICD-10-CM

## 2015-01-27 DIAGNOSIS — N950 Postmenopausal bleeding: Secondary | ICD-10-CM | POA: Diagnosis not present

## 2015-01-27 DIAGNOSIS — Z79890 Hormone replacement therapy (postmenopausal): Secondary | ICD-10-CM

## 2015-01-27 NOTE — Progress Notes (Signed)
59 y.o.  Laura Munoz here for a pelvic ultrasound due to intermittent PMP bleeding that started about four years ago after starting HRT.  Reports she is doing really well with the HRT.  Does have some long term concerns about bone health so feels this is a good option for her.  Reports having some irregular bleeding after starting Prempro in 11/11.  This has gradually improved but she continues to have some intermittent spotting.  She reports it is irregular and always in the morning after a bowel movement.  It is red.  She has no cramping or clots with this.  This typically lasts two days and then she has some dark discharge, like old blood, that occurs after the red bleeding.  Most recent dates of bleeding are:  1/22-1/23, 2/6, 2/20.    Pt feels this has gradually improved with the HRT but does still continue.  Pt is long term pt of Dr. Julieta Bellini.  Saw Dr. Charlies Constable last year and then Melvia Heaps this year.  Ultrasound and possible endometrial biopsy was recommended.  Pt not sure any of this is necessary.  Patient's last menstrual period was 10/23/2013.  Sexually active:  yes  Contraception: PMP status  FINDINGS: UTERUS: 6.1 x 4.5 x 2.72mm EMS: 2.67mm with calcification and possible increased vascularity ADNEXA:   Left ovary 2.5 x 1.3 x 1.0cm   Right ovary 1.7 x 1.3 x 1.0cm CUL DE SAC: no free fluid  Images reviewed with pt.  Due to calcifications and possible increased flow, in addition to continued PMP bleeding, really feel that proceeding with endometrial biopsy is warranted.  Lengthy discussion about risks, benefits, and alternatives given.  No real alternative to biopsy except D&C exists.  Pt isn't interested in this.  However, I did advise if she wanted to stop her HRT to see if all of this resolved, that is a possible option.  She, also, does not want to do this.  Of course, advised she could decline a biopsy but I did not recommend that as well.  After some consideration, she consented  to proceed today.    Verbal and written consent obtained.   Procedure:  Speculum placed.  Cervix visualized and cleansed with betadine prep.  A single toothed tenaculum was applied to the anterior lip of the cervix.  Dilation of the cervix with a Milex dilator was necessary.  Endometrial pipelle was advanced through the cervix into the endometrial cavity without difficulty.  Pipelle passed to 6.5cm.  Suction applied and pipelle removed with good tissue sample obtained.  Tenculum removed.  No bleeding noted.  Patient tolerated procedure well.  Assessment:  Irregular, PMP bleeding in pt on HRT, endometrial calcifications with possible increased vascular flow  Plan: Results will be called to pt.  May need to proceed with change in HRT vs hysteroscopy depending on results.  Pt will be called.  ~35 minutes spent with patient >50% of time was in face to face discussion of above after ultrasound was completed but before biopsy was done.

## 2015-01-28 ENCOUNTER — Encounter: Payer: Self-pay | Admitting: Obstetrics & Gynecology

## 2015-01-28 DIAGNOSIS — Z79890 Hormone replacement therapy (postmenopausal): Secondary | ICD-10-CM | POA: Insufficient documentation

## 2015-02-01 ENCOUNTER — Ambulatory Visit: Payer: 59 | Admitting: Certified Nurse Midwife

## 2015-02-03 ENCOUNTER — Telehealth: Payer: Self-pay | Admitting: Obstetrics & Gynecology

## 2015-02-03 MED ORDER — CONJ ESTROG-MEDROXYPROGEST ACE 0.625-5 MG PO TABS: 1.0000 | ORAL_TABLET | Freq: Every day | ORAL | Status: DC

## 2015-02-03 NOTE — Telephone Encounter (Signed)
Spoke with patient. Results given as seen below. Patient is agreeable and verbalizes understanding. 3 month follow up scheduled for 6/14 at 2:30pm with Dr.Miller. Patient is agreeable to date and time. Patient would also like to switch aex in 12/2015 to see Dr.Miller. Appointment scheduled for 01/02/2016 at 2pm with Dr.Miller. Patient is agreeable. Rx for Prempro 0.625/5 #90 sent to [facility] outpatient pharmacy. Old prempro rx discontinued.   Notes Recorded by Megan Salon, MD on 02/02/2015 at 9:19 PM Please inform pt biopsy was negative for any abnormal cells. I would like to change her HRT to Prempro 0.625/5. She is currently on 0.625/2.5 dosage. This change would increase her progesterone and hopefully get the bleeding to stop completely. She can finish what she has and start after current Rx is complete. I would like to see her three months after she changes to the new dosage.  Routing to provider for final review. Patient agreeable to disposition. Will close encounter

## 2015-02-03 NOTE — Telephone Encounter (Signed)
Patient is a calling for recent biopsy results. Patient says she was told her results would be in in about 3 days. Patient was last seen 01/27/15. Patient is starting to get concerned.

## 2015-04-14 ENCOUNTER — Other Ambulatory Visit: Payer: Self-pay | Admitting: Obstetrics & Gynecology

## 2015-04-14 DIAGNOSIS — Z1231 Encounter for screening mammogram for malignant neoplasm of breast: Secondary | ICD-10-CM

## 2015-04-19 ENCOUNTER — Other Ambulatory Visit: Payer: Self-pay | Admitting: Obstetrics & Gynecology

## 2015-04-19 NOTE — Telephone Encounter (Signed)
Medication refill request: Prempro 0.625-5 mg Last AEX:  12/28/14 with DL  Next AEX:  01/02/16 with SM Last MMG (if hormonal medication request):  Refill authorized: #30/0 rfs  According to telephone encounter 02/03/15 patient was started on Prempro and was to return to office in about 3 months for recheck. Patient is scheduled for recheck  05/10/15 with Dr. Sabra Heck okay to send in #30/0 rfs, please advise.

## 2015-04-19 NOTE — Telephone Encounter (Signed)
Patient would like a refill for [facility] [address] at [phone].

## 2015-04-20 MED ORDER — CONJ ESTROG-MEDROXYPROGEST ACE 0.625-5 MG PO TABS: 1.0000 | ORAL_TABLET | Freq: Every day | ORAL | Status: DC

## 2015-04-20 NOTE — Telephone Encounter (Signed)
Yes.  RFs are okay.  Authorized.

## 2015-04-21 ENCOUNTER — Ambulatory Visit (HOSPITAL_COMMUNITY)
Admission: RE | Admit: 2015-04-21 | Discharge: 2015-04-21 | Disposition: A | Discharge status: Home / Self Care | Payer: 59 | Source: Ambulatory Visit | Attending: Obstetrics & Gynecology | Admitting: Obstetrics & Gynecology

## 2015-04-21 DIAGNOSIS — Z1231 Encounter for screening mammogram for malignant neoplasm of breast: Secondary | ICD-10-CM | POA: Insufficient documentation

## 2015-04-21 NOTE — Telephone Encounter (Signed)
Patient notified that rx has been sent. 

## 2015-04-26 ENCOUNTER — Encounter: Payer: Self-pay | Admitting: Gastroenterology

## 2015-05-10 ENCOUNTER — Encounter: Payer: Self-pay | Admitting: Obstetrics & Gynecology

## 2015-05-10 ENCOUNTER — Ambulatory Visit (INDEPENDENT_AMBULATORY_CARE_PROVIDER_SITE_OTHER): Payer: 59 | Admitting: Obstetrics & Gynecology

## 2015-05-10 VITALS — BP 110/78 | HR 72 | Resp 14 | Wt 111.0 lb

## 2015-05-10 DIAGNOSIS — Z79890 Hormone replacement therapy (postmenopausal): Secondary | ICD-10-CM | POA: Diagnosis not present

## 2015-05-10 DIAGNOSIS — N9489 Other specified conditions associated with female genital organs and menstrual cycle: Secondary | ICD-10-CM

## 2015-05-10 MED ORDER — CONJ ESTROG-MEDROXYPROGEST ACE 0.625-5 MG PO TABS: 1.0000 | ORAL_TABLET | Freq: Every day | ORAL | Status: DC

## 2015-05-10 NOTE — Progress Notes (Signed)
Subjective:     Patient ID: Laura Munoz, female   DOB: Oct 05, 1956, 59 y.o.   MRN: [id number]  HPI 59 yo G0 MWF here for follow up after having PMP bleeding while on HRT.  Pt seen for AEX 12/28/14 and she returned 01/27/15 for PUS.  This showed a small area of calcifications and vascularity.  Endometrial biopsy was recommended, which was negative.  Pt's hRT dosage was change--the provera in her Prem/Pro was increased from 2.5 to 5mg  daily  She has not experienced any additional bleeding since this change was made.  She does have several other questions.  Pt reports some increased yellowish, sticky discharge at the end of each day.  Is not malodorous.  Denies pain with intercourse.  Questions if this is normal.  D/W pt typical PMP vaginal discharge that is seen as well as treatment options--vaginal estrogen or other vaginal estrogen products.  Pt not really interested in additional options at this time.  Wants to review prior ultrasound again.  Is anxious about the area of calcification and vascularity.  D/w pt cancers that can be possible--sarcoma and endometrial cancer--but this findings is not necessarily associated with either of these and these are unlikely due to findings and negative endometrial biopsy.  D/W pt repeating PUS in 4-6 months after last one.  She would like that very much  LLL  Review of Systems  All other systems reviewed and are negative.      Objective:   Physical Exam  Constitutional: She is oriented to person, place, and time. She appears well-developed.  Neurological: She is alert and oriented to person, place, and time.  Skin: Skin is warm and dry.  Psychiatric: She has a normal mood and affect.   No other exam performed    Assessment:     PMP bleeding, resolved with change in progesterone in HRT Endometrial calcifications with area of vascularity Negative endometrial biopsy 3/3 /16     Plan:     Continue Prempro 0.625/5mg  rx to pharmacy.  #90/4RF PUS here in  2-3 months.  Will scheduled before pt leaves today.  ~15 minutes spent with patient >50% of time was in face to face discussion of above.

## 2015-05-10 NOTE — Progress Notes (Signed)
Patient verbalized understanding of the [facility] appointment cancellation policy. Advised will need to cancel or reschedule within 72 business hours of appointment (3 business days) or will have $100.00 late cancellation fee placed to account.   Ultrasound scheduled for 06/23/15 at 1330 with consult at 1400.

## 2015-06-23 ENCOUNTER — Ambulatory Visit (INDEPENDENT_AMBULATORY_CARE_PROVIDER_SITE_OTHER): Payer: 59

## 2015-06-23 ENCOUNTER — Other Ambulatory Visit: Payer: Self-pay

## 2015-06-23 ENCOUNTER — Other Ambulatory Visit: Payer: 59

## 2015-06-23 ENCOUNTER — Other Ambulatory Visit: Payer: 59 | Admitting: Obstetrics & Gynecology

## 2015-06-23 ENCOUNTER — Ambulatory Visit (INDEPENDENT_AMBULATORY_CARE_PROVIDER_SITE_OTHER): Payer: 59 | Admitting: Obstetrics & Gynecology

## 2015-06-23 ENCOUNTER — Other Ambulatory Visit: Payer: 59 | Admitting: Obstetrics and Gynecology

## 2015-06-23 ENCOUNTER — Other Ambulatory Visit: Payer: Self-pay | Admitting: *Deleted

## 2015-06-23 ENCOUNTER — Ambulatory Visit: Payer: 59

## 2015-06-23 DIAGNOSIS — N950 Postmenopausal bleeding: Secondary | ICD-10-CM | POA: Diagnosis not present

## 2015-06-23 DIAGNOSIS — Z79890 Hormone replacement therapy (postmenopausal): Secondary | ICD-10-CM

## 2015-06-23 DIAGNOSIS — N9489 Other specified conditions associated with female genital organs and menstrual cycle: Secondary | ICD-10-CM

## 2015-06-23 NOTE — Progress Notes (Signed)
Uterus 6.4 x 4.0 x 2.7cm.  Endometrium 3.54mm.  Left ovary 2.1 x 1.3 x 1.0cm.  Right ovary 1.9 x 1.5 x 0.9cm.  Possible endometrial polyp 7 x 63mm noted.  No free fluid.

## 2015-06-30 ENCOUNTER — Telehealth: Payer: Self-pay | Admitting: Obstetrics & Gynecology

## 2015-06-30 ENCOUNTER — Other Ambulatory Visit: Payer: Self-pay | Admitting: Obstetrics & Gynecology

## 2015-06-30 MED ORDER — CONJ ESTROG-MEDROXYPROGEST ACE 0.625-2.5 MG PO TABS: 1.0000 | ORAL_TABLET | Freq: Every day | ORAL | Status: DC

## 2015-06-30 NOTE — Telephone Encounter (Signed)
Patient was seen last week for PUS and was told she would get a call with results. Patient is calling for results and would like to talk about the change made to her medication. Last seen 06/23/15.

## 2015-06-30 NOTE — Telephone Encounter (Signed)
Spoke with pt and discussed results with pt.  Possible polyp noted.  Hysteroscopy with polyp resection and D&C recommended.  Pt would like 07/25/15 due to some traveling going on this months.  Procedure, risks and benefits all discussed.    CC:  Gay Filler for scheduling

## 2015-06-30 NOTE — Telephone Encounter (Signed)
Spoke with patient. Advised I will have Dr.Miller review her PUS from 06/23/2015 and return call with further recommendations. Patient is agreeable. States that she used to be on Prempro .625-2.5 daily and was having dark discharge monthly for 2 days. Dr.Miller changed rx to Prempro .625-5. July 1-3 patient began to have dark discharge only in the mornings and with bowel movements. Has not had any since. Is having continual "clear sticky" discharge daily. Patient states she is unsure what to do now because she has still had the dark discharge and now has clear sticky discharge daily. Advised I will speak with Dr.Miller and return call with further recommendations. Patient is agreeable.

## 2015-06-30 NOTE — Progress Notes (Signed)
Rx changed to Prempro 0.625/2.5 due to increased discharge on prempro 0.625/5.

## 2015-07-01 ENCOUNTER — Other Ambulatory Visit: Payer: Self-pay

## 2015-07-01 ENCOUNTER — Other Ambulatory Visit: Payer: Self-pay | Admitting: Emergency Medicine

## 2015-07-01 MED ORDER — CONJ ESTROG-MEDROXYPROGEST ACE 0.625-2.5 MG PO TABS: 1.0000 | ORAL_TABLET | Freq: Every day | ORAL | Status: DC

## 2015-07-01 NOTE — Telephone Encounter (Signed)
Call to patient. Advised surgical scheduling nurse, Gay Filler is out of the office today but does have message and will return her call to set up surgery. Patient will be out of town 8/15 and 8/22, is still open for 07/25/15. Advised would let Gay Filler know her preferences. Patient agreeable to plan for follow up scheduling.

## 2015-07-01 NOTE — Progress Notes (Signed)
Incoming call from patient. Husband is at pharmacy to pick up Prempro, order is not there. Re-ordered rx from Dr. Sabra Heck and patient advised. Apologized for inconvenience. Order sent at this time.  Confirmed receipt from pharmacy.   Routing to provider for final review. Patient agreeable to disposition. Will close encounter.

## 2015-07-03 ENCOUNTER — Encounter: Payer: Self-pay | Admitting: Obstetrics & Gynecology

## 2015-07-05 NOTE — Telephone Encounter (Signed)
Call from patient. She is checking on surgery date. Advised precert is in process and date is available as she discussed with Olivia Mackie.  Surgery instruction sheet reviewed with patient and printed copy mailed, see scanned copy.  Patient to call back if any additional questions before surgery consult with Dr Sabra Heck on 07-11-15. Case request sent to central scheduling.  Routing to provider for final review. Patient agreeable to disposition. Will close encounter.

## 2015-07-06 ENCOUNTER — Other Ambulatory Visit: Payer: Self-pay | Admitting: Obstetrics & Gynecology

## 2015-07-07 ENCOUNTER — Telehealth: Payer: Self-pay | Admitting: *Deleted

## 2015-07-07 NOTE — Telephone Encounter (Signed)
Call to patient. Notified surgery start time adjusted to 1015 on 07-25-15. Patient is agreeable to this and aware hospital will instruct her what time to arrive.  Routing to provider for final review. Will close encounter.

## 2015-07-11 ENCOUNTER — Telehealth: Payer: Self-pay | Admitting: Obstetrics & Gynecology

## 2015-07-11 ENCOUNTER — Encounter: Payer: Self-pay | Admitting: Obstetrics & Gynecology

## 2015-07-11 ENCOUNTER — Ambulatory Visit (INDEPENDENT_AMBULATORY_CARE_PROVIDER_SITE_OTHER): Payer: 59 | Admitting: Obstetrics & Gynecology

## 2015-07-11 VITALS — BP 108/72 | HR 68 | Resp 14 | Wt 110.0 lb

## 2015-07-11 DIAGNOSIS — N840 Polyp of corpus uteri: Secondary | ICD-10-CM | POA: Diagnosis not present

## 2015-07-11 MED ORDER — HYDROCODONE-ACETAMINOPHEN 5-325 MG PO TABS: 1.0000 | ORAL_TABLET | Freq: Four times a day (QID) | ORAL | Status: DC | PRN

## 2015-07-11 NOTE — Telephone Encounter (Signed)
Call back to patient. Original surgery letter was mailed prior to time change. Patient was out of town and just received letter.  Confirmed that surgery is scheduled for 07-25-15 at [facility] and time has been changed to between 1015 and 1030. She should plan to arrive at [facility]. Patient has surgery consult today ay 4pm with Dr Sabra Heck.  Routing to provider for final review.  Will close encounter.

## 2015-07-11 NOTE — Telephone Encounter (Signed)
Patient calling to confirm surgery date and time. She said, "I got a verbal date and time but the letter I received had a different time."

## 2015-07-11 NOTE — Progress Notes (Signed)
Patient ID: Laura Munoz, female   DOB: 01-06-1956, 59 y.o.   MRN: [id number]  59 y.o. G0P0000 MarriedCaucasian female here for discussion of upcoming procedure. Dilatation and Curettage/hysteroscopy with resectoscope planned on 07/25/15, due to endometrial polyp noted with ultrasound done 06/23/15.  Pt had prior ultrasound and biopsy 01/27/15.  Biopsy was negative for abnormal cells and showed superficial benign endometrial fragments.  No atypia or malignancy was seen.     Procedure discussed with patient.  Recovery and pain management discussed.  Risks discussed including but not limited to bleeding, rare risk of transfusion, infection, 1% risk of uterine perforation with risks of fluid deficit causing cardiac arrythmia, cerebral swelling and/or need to stop procedure early.  Fluid emboli and rare risk of death discussed.  DVT/PE, rare risk of risk of bowel/bladder/ureteral/vascular injury.  Patient aware if pathology abnormal she may need additional treatment.  All questions answered.    Ob Hx:   Patient's last menstrual period was 10/23/2013.          Sexually active: no  Birth control: PMP Last pap: 12/28/14 neg Last MMG: 04/21/15 Tobacco: none  Past Surgical History  Procedure Laterality Date  . No past surgeries      Past Medical History  Diagnosis Date  . Hx of colonic polyps   . Iliotibial band syndrome of right side 09/2011  . Talipes cavus 02/2010  . At risk for decreased bone density     DEXA 03/2011 normal (-1.0 at R fem)  . Depression     reactive  . Breast nodule 04/1996    left    Allergies: Codeine  Current Outpatient Prescriptions  Medication Sig Dispense Refill  . Calcium Carbonate-Vit D-Min (CALCIUM 1200 PO) Take by mouth daily.      Marland Kitchen estrogen, conjugated,-medroxyprogesterone (PREMPRO) 0.625-2.5 MG per tablet Take 1 tablet by mouth daily. 90 tablet 2  . Multiple Vitamin (MULTIVITAMIN) tablet Take 1 tablet by mouth daily.      Marland Kitchen pyridOXINE (VITAMIN B-6) 100 MG tablet  Take 100 mg by mouth daily.    . sertraline (ZOLOFT) 100 MG tablet Take 0.5 tablets (50 mg total) by mouth every other day. As directed 90 tablet 3   No current facility-administered medications for this visit.    ROS: A comprehensive review of systems was negative.  Exam:    BP 108/72 mmHg  Pulse 68  Resp 14  Wt 110 lb (49.896 kg)  LMP 10/23/2013   No additional physical exam was performed.  A: PMP bleeding  Thickened endometrium Endometrial polyp    P: Hysteroscopy with polyp resection, D&C planned Rx for Vicodin given.  Pt not planning on getting this filled at this time. Medications/Vitamins reviewed.   ~15 minutes spent with patient >50% of time was in face to face discussion of above.

## 2015-07-20 NOTE — Anesthesia Preprocedure Evaluation (Addendum)
Anesthesia Evaluation  Patient identified by MRN, date of birth, ID band Patient awake    Reviewed: Allergy & Precautions, NPO status , Patient's Chart, lab work & pertinent test results  Airway Mallampati: II    Positive for:  Tracheal deviation   Dental  (+) Teeth Intact, Dental Advisory Given, Loose,    Pulmonary neg pulmonary ROS,  breath sounds clear to auscultation        Cardiovascular Rhythm:Regular  EKG 2013 WNL   Neuro/Psych Depression    GI/Hepatic negative GI ROS, Neg liver ROS,   Endo/Other    Renal/GU negative Renal ROS     Musculoskeletal   Abdominal (+)  Abdomen: soft.    Peds  Hematology 12.5/37   Anesthesia Other Findings   Reproductive/Obstetrics PG test NEG                          Anesthesia Physical Anesthesia Plan  ASA: II  Anesthesia Plan: General   Post-op Pain Management:    Induction: Intravenous  Airway Management Planned: LMA  Additional Equipment:   Intra-op Plan:   Post-operative Plan:   Informed Consent: I have reviewed the patients History and Physical, chart, labs and discussed the procedure including the risks, benefits and alternatives for the proposed anesthesia with the patient or authorized representative who has indicated his/her understanding and acceptance.     Plan Discussed with:   Anesthesia Plan Comments: (Check am labs)        Anesthesia Quick Evaluation

## 2015-07-25 ENCOUNTER — Ambulatory Visit (HOSPITAL_COMMUNITY): Payer: 59 | Admitting: Anesthesiology

## 2015-07-25 ENCOUNTER — Encounter (HOSPITAL_COMMUNITY)
Admission: RE | Disposition: A | Discharge status: Home / Self Care | Payer: Self-pay | Source: Ambulatory Visit | Attending: Obstetrics & Gynecology

## 2015-07-25 ENCOUNTER — Ambulatory Visit (HOSPITAL_COMMUNITY)
Admission: RE | Admit: 2015-07-25 | Discharge: 2015-07-25 | Disposition: A | Discharge status: Home / Self Care | Payer: 59 | Source: Ambulatory Visit | Attending: Obstetrics & Gynecology | Admitting: Obstetrics & Gynecology

## 2015-07-25 DIAGNOSIS — N858 Other specified noninflammatory disorders of uterus: Secondary | ICD-10-CM | POA: Diagnosis not present

## 2015-07-25 DIAGNOSIS — J398 Other specified diseases of upper respiratory tract: Secondary | ICD-10-CM | POA: Insufficient documentation

## 2015-07-25 DIAGNOSIS — N950 Postmenopausal bleeding: Secondary | ICD-10-CM | POA: Diagnosis not present

## 2015-07-25 HISTORY — PX: DILATATION & CURRETTAGE/HYSTEROSCOPY WITH RESECTOCOPE: SHX5572

## 2015-07-25 LAB — CBC
HEMATOCRIT: 37.5 % (ref 36.0–46.0)
Hemoglobin: 12.5 g/dL (ref 12.0–15.0)
MCH: 30.2 pg (ref 26.0–34.0)
MCHC: 33.3 g/dL (ref 30.0–36.0)
MCV: 90.6 fL (ref 78.0–100.0)
Platelets: 265 10*3/uL (ref 150–400)
RBC: 4.14 MIL/uL (ref 3.87–5.11)
RDW: 14.2 % (ref 11.5–15.5)
WBC: 4.1 10*3/uL (ref 4.0–10.5)

## 2015-07-25 LAB — PREGNANCY, URINE: Preg Test, Ur: NEGATIVE

## 2015-07-25 SURGERY — DILATATION & CURETTAGE/HYSTEROSCOPY WITH RESECTOCOPE
Anesthesia: General

## 2015-07-25 MED ORDER — PROMETHAZINE HCL 25 MG/ML IJ SOLN
6.2500 mg | INTRAMUSCULAR | Status: DC | PRN
Start: 2015-07-25 — End: 2015-07-25

## 2015-07-25 MED ORDER — PHENYLEPHRINE HCL 10 MG/ML IJ SOLN
INTRAMUSCULAR | Status: DC | PRN
Start: 2015-07-25 — End: 2015-07-25
  Administered 2015-07-25: 40 ug via INTRAVENOUS

## 2015-07-25 MED ORDER — FENTANYL CITRATE (PF) 100 MCG/2ML IJ SOLN: 25.0000 ug | INTRAMUSCULAR | Status: DC | PRN

## 2015-07-25 MED ORDER — DEXAMETHASONE SODIUM PHOSPHATE 4 MG/ML IJ SOLN
INTRAMUSCULAR | Status: AC
  Filled 2015-07-25: qty 1

## 2015-07-25 MED ORDER — GLYCINE 1.5 % IR SOLN
Status: DC | PRN
Start: 2015-07-25 — End: 2015-07-25
  Administered 2015-07-25: 3000 mL

## 2015-07-25 MED ORDER — ONDANSETRON HCL 4 MG/2ML IJ SOLN
INTRAMUSCULAR | Status: AC
  Filled 2015-07-25: qty 2

## 2015-07-25 MED ORDER — KETOROLAC TROMETHAMINE 30 MG/ML IJ SOLN
INTRAMUSCULAR | Status: AC
Start: 2015-07-25 — End: 2015-07-25
  Filled 2015-07-25: qty 1

## 2015-07-25 MED ORDER — LIDOCAINE HCL (CARDIAC) 20 MG/ML IV SOLN
INTRAVENOUS | Status: AC
  Filled 2015-07-25: qty 5

## 2015-07-25 MED ORDER — MEPERIDINE HCL 25 MG/ML IJ SOLN: 6.2500 mg | INTRAMUSCULAR | Status: DC | PRN

## 2015-07-25 MED ORDER — LIDOCAINE-EPINEPHRINE 1 %-1:100000 IJ SOLN
INTRAMUSCULAR | Status: AC
  Filled 2015-07-25: qty 1

## 2015-07-25 MED ORDER — CEFAZOLIN SODIUM-DEXTROSE 2-3 GM-% IV SOLR
2.0000 g | INTRAVENOUS | Status: AC
  Administered 2015-07-25: 2 g via INTRAVENOUS

## 2015-07-25 MED ORDER — KETOROLAC TROMETHAMINE 30 MG/ML IJ SOLN
INTRAMUSCULAR | Status: DC | PRN
  Administered 2015-07-25: 30 mg via INTRAVENOUS

## 2015-07-25 MED ORDER — ACETAMINOPHEN 500 MG PO TABS
ORAL_TABLET | ORAL | Status: AC
  Administered 2015-07-25: 1000 mg via ORAL
  Filled 2015-07-25: qty 2

## 2015-07-25 MED ORDER — HYDROCODONE-ACETAMINOPHEN 5-325 MG PO TABS: 1.0000 | ORAL_TABLET | Freq: Once | ORAL | Status: DC

## 2015-07-25 MED ORDER — ACETAMINOPHEN 500 MG PO TABS
1000.0000 mg | ORAL_TABLET | Freq: Once | ORAL | Status: AC
  Administered 2015-07-25: 1000 mg via ORAL

## 2015-07-25 MED ORDER — ONDANSETRON HCL 4 MG/2ML IJ SOLN
INTRAMUSCULAR | Status: DC | PRN
  Administered 2015-07-25: 4 mg via INTRAVENOUS

## 2015-07-25 MED ORDER — FENTANYL CITRATE (PF) 100 MCG/2ML IJ SOLN
INTRAMUSCULAR | Status: DC | PRN
  Administered 2015-07-25: 50 ug via INTRAVENOUS

## 2015-07-25 MED ORDER — SCOPOLAMINE 1 MG/3DAYS TD PT72
MEDICATED_PATCH | TRANSDERMAL | Status: AC
  Administered 2015-07-25: 1.5 mg via TRANSDERMAL
  Filled 2015-07-25: qty 1

## 2015-07-25 MED ORDER — PHENYLEPHRINE 40 MCG/ML (10ML) SYRINGE FOR IV PUSH (FOR BLOOD PRESSURE SUPPORT)
PREFILLED_SYRINGE | INTRAVENOUS | Status: AC
  Filled 2015-07-25: qty 10

## 2015-07-25 MED ORDER — SCOPOLAMINE 1 MG/3DAYS TD PT72
1.0000 | MEDICATED_PATCH | Freq: Once | TRANSDERMAL | Status: DC
  Administered 2015-07-25: 1.5 mg via TRANSDERMAL

## 2015-07-25 MED ORDER — DEXAMETHASONE SODIUM PHOSPHATE 10 MG/ML IJ SOLN
INTRAMUSCULAR | Status: DC | PRN
Start: 2015-07-25 — End: 2015-07-25
  Administered 2015-07-25: 4 mg via INTRAVENOUS

## 2015-07-25 MED ORDER — LIDOCAINE-EPINEPHRINE 1 %-1:100000 IJ SOLN
INTRAMUSCULAR | Status: DC | PRN
  Administered 2015-07-25: 10 mL

## 2015-07-25 MED ORDER — PROPOFOL 10 MG/ML IV BOLUS
INTRAVENOUS | Status: AC
Start: 2015-07-25 — End: 2015-07-25
  Filled 2015-07-25: qty 20

## 2015-07-25 MED ORDER — FENTANYL CITRATE (PF) 100 MCG/2ML IJ SOLN
INTRAMUSCULAR | Status: AC
  Filled 2015-07-25: qty 4

## 2015-07-25 MED ORDER — PROPOFOL 10 MG/ML IV BOLUS
INTRAVENOUS | Status: DC | PRN
Start: 2015-07-25 — End: 2015-07-25
  Administered 2015-07-25: 150 mg via INTRAVENOUS

## 2015-07-25 MED ORDER — LACTATED RINGERS IV SOLN
INTRAVENOUS | Status: DC
  Administered 2015-07-25 (×2): via INTRAVENOUS

## 2015-07-25 MED ORDER — LIDOCAINE HCL (CARDIAC) 20 MG/ML IV SOLN
INTRAVENOUS | Status: DC | PRN
  Administered 2015-07-25: 60 mg via INTRAVENOUS

## 2015-07-25 MED ORDER — MIDAZOLAM HCL 2 MG/2ML IJ SOLN
INTRAMUSCULAR | Status: DC | PRN
  Administered 2015-07-25: 2 mg via INTRAVENOUS

## 2015-07-25 MED ORDER — MIDAZOLAM HCL 2 MG/2ML IJ SOLN
INTRAMUSCULAR | Status: AC
  Filled 2015-07-25: qty 4

## 2015-07-25 SURGICAL SUPPLY — 18 items
CANISTER SUCT 3000ML (MISCELLANEOUS) ×2 IMPLANT
CATH ROBINSON RED A/P 16FR (CATHETERS) ×2 IMPLANT
CLOTH BEACON ORANGE TIMEOUT ST (SAFETY) ×2 IMPLANT
CONTAINER PREFILL 10% NBF 60ML (FORM) ×4 IMPLANT
DILATOR CANAL MILEX (MISCELLANEOUS) IMPLANT
ELECT REM PT RETURN 9FT ADLT (ELECTROSURGICAL)
ELECTRODE REM PT RTRN 9FT ADLT (ELECTROSURGICAL) IMPLANT
GLOVE BIOGEL PI IND STRL 7.0 (GLOVE) ×1 IMPLANT
GLOVE BIOGEL PI INDICATOR 7.0 (GLOVE) ×1
GLOVE ECLIPSE 6.5 STRL STRAW (GLOVE) ×4 IMPLANT
GOWN STRL REUS W/TWL LRG LVL3 (GOWN DISPOSABLE) ×4 IMPLANT
LOOP ANGLED CUTTING 22FR (CUTTING LOOP) IMPLANT
PACK VAGINAL MINOR WOMEN LF (CUSTOM PROCEDURE TRAY) ×2 IMPLANT
PAD OB MATERNITY 4.3X12.25 (PERSONAL CARE ITEMS) ×2 IMPLANT
TOWEL OR 17X24 6PK STRL BLUE (TOWEL DISPOSABLE) ×4 IMPLANT
TUBING AQUILEX INFLOW (TUBING) ×2 IMPLANT
TUBING AQUILEX OUTFLOW (TUBING) ×2 IMPLANT
WATER STERILE IRR 1000ML POUR (IV SOLUTION) ×2 IMPLANT

## 2015-07-25 NOTE — Op Note (Signed)
07/25/2015  12:00 PM  PATIENT:  Laura Munoz  59 y.o. female  PRE-OPERATIVE DIAGNOSIS:  endometrial mass, PMP bleeding  POST-OPERATIVE DIAGNOSIS:  endometrial calcifications  PROCEDURE:  Procedure(s): DILATATION & CURETTAGE/HYSTEROSCOPY  SURGEON:  Divina Neale SUZANNE  ASSISTANTS: OR staff   ANESTHESIA:   LMA  ESTIMATED BLOOD LOSS: 10cc  BLOOD ADMINISTERED:none   FLUIDS: 1000cc LR  UOP: 75cc drained with I&O cath at beginning of procedure  SPECIMEN:  Endometrial curettings   DISPOSITION OF SPECIMEN:  PATHOLOGY  FINDINGS: endometrial calcifications  DESCRIPTION OF OPERATION: Patient was taken to the operating room.  She is placed in the supine position. SCDs were on her lower extremities and functioning properly. General anesthesia with an LMA was administered without difficulty.   Legs were then placed in the [place] in the low lithotomy position. The legs were lifted to the high lithotomy position and the Betadine prep was used on the inner thighs perineum and vagina x3. Patient was draped in a normal standard fashion. An in and out catheterization with a red rubber Foley catheter was performed. Approximately 75 cc of clear urine was noted. A bivalve speculum was placed the vagina. The anterior lip of the cervix was grasped with single-tooth tenaculum.  A paracervical block of 1% lidocaine mixed one-to-one with epinephrine (1:100,000 units).  10 cc was used total. The cervix is dilated up to #21 [facility] dilators. The endometrial cavity sounded to 6.5 cm.   A 2.9 millimeter diagnostic hysteroscope was obtained. 1.5% glycine was used as a hysteroscopic fluid. The hysteroscope was advanced through the endocervical canal into the endometrial cavity. The tubal ostia were noted bilaterally. There were endometrial calcifications noted within the cavity.  Using a  #1 toothed curette, the cavity was curetted until a rough gritty texture was noted in all quadrants.  The cavity was  revisualized.  Most of the deposits were gone.  Using a polyp forceps, the remaining calcifications were removed without difficulty.  These were put with endometrial curettings to be sent the pathology.  The cavity was visualized one final time.  No lesions were noted.  The hysteroscope was removed. The fluid deficit was 85 cc. The tenaculum was removed from the anterior lip of the cervix. Silver nitrate were used on the tenaculum sites for excellent hemostasis.  The speculum was removed from the vagina. The prep was cleansed of the patient's skin. The legs are positioned back in the supine position. Sponge, lap, needle, initially counts were correct x2. Patient was taken to recovery in stable condition.  COUNTS:  YES  PLAN OF CARE: Transfer to PACU

## 2015-07-25 NOTE — Discharge Instructions (Signed)
Post-surgical Instructions, Outpatient Surgery ° °You may expect to feel dizzy, weak, and drowsy for as long as 24 hours after receiving the medicine that made you sleep (anesthetic). For the first 24 hours after your surgery:   °Do not drive a car, ride a bicycle, participate in physical activities, or take public transportation until you are done taking narcotic pain medicines or as directed by Dr. Nafis Farnan.  °Do not drink alcohol or take tranquilizers.  °Do not take medicine that has not been prescribed by your physicians.  °Do not sign important papers or make important decisions while on narcotic pain medicines.  °Have a responsible person with you.  ° °PAIN MANAGEMENT °Motrin 800mg.  (This is the same as 4-200mg over the counter tablets of Motrin or ibuprofen.)  You may take this every eight hours or as needed for cramping.   °Vicodin 5/325mg.  For more severe pain, take one or two tablets every four to six hours as needed for pain control.  (Remember that narcotic pain medications increase your risk of constipation.  If this becomes a problem, you may take an over the counter stool softener like Colace 100mg up to four times a day.) ° °DO'S AND DON'T'S °Do not take a tub bath for one week.  You may shower on the first day after your surgery °Do not do any heavy lifting for one to two weeks.  This increases the chance of bleeding. °Do move around as you feel able.  Stairs are fine.  You may begin to exercise again as you feel able.  Do not lift any weights for two weeks. °Do not put anything in the vagina for two weeks--no tampons, intercourse, or douching.   ° °REGULAR MEDIATIONS/VITAMINS: °You may restart all of your regular medications as prescribed. °You may restart all of your vitamins as you normally take them.   ° °PLEASE CALL OR SEEK MEDICAL CARE IF: °You have persistent nausea and vomiting.  °You have trouble eating or drinking.  °You have an oral temperature above 100.5.  °You have constipation that is  not helped by adjusting diet or increasing fluid intake. Pain medicines are a common cause of constipation.  °You have heavy vaginal bleeding ° ° ° °

## 2015-07-25 NOTE — Anesthesia Procedure Notes (Signed)
Procedure Name: LMA Insertion Date/Time: 07/25/2015 11:15 AM Performed by: Georgeanne Nim Pre-anesthesia Checklist: Patient identified, Timeout performed, Emergency Drugs available, Suction available and Patient being monitored Patient Re-evaluated:Patient Re-evaluated prior to inductionOxygen Delivery Method: Circle system utilized Preoxygenation: Pre-oxygenation with 100% oxygen Intubation Type: IV induction LMA: LMA inserted LMA Size: 4.0 Number of attempts: 1 Placement Confirmation: positive ETCO2 and breath sounds checked- equal and bilateral Tube secured with: Tape Dental Injury: Teeth and Oropharynx as per pre-operative assessment

## 2015-07-25 NOTE — Anesthesia Postprocedure Evaluation (Signed)
  Anesthesia Post-op Note  Patient: Laura Munoz  Procedure(s) Performed: Procedure(s): DILATATION & CURETTAGE/HYSTEROSCOPY (N/A)  Patient Location: PACU  Anesthesia Type:General  Level of Consciousness: awake and alert   Airway and Oxygen Therapy: Patient Spontanous Breathing and Patient connected to nasal cannula oxygen  Post-op Pain: mild  Post-op Assessment: Post-op Vital signs reviewed, Patient's Cardiovascular Status Stable, Respiratory Function Stable, Patent Airway and No signs of Nausea or vomiting              Post-op Vital Signs: Reviewed and stable  Last Vitals:  Filed Vitals:   07/25/15 1200  BP: 115/70  Pulse: 64  Temp:   Resp: 21    Complications: No apparent anesthesia complications

## 2015-07-25 NOTE — H&P (Signed)
Laura Munoz is an 59 y.o. female G0 MWF here for hysteroscopy with possible polyp resection, D&C due to continued PMP bleeding despite manipulation of HRT dosage.  Ultrasound on 06/23/15 whoed uterus 6.4 x 4.0 x 2.7cm with endometrium of 3.67mm.  Possible polyp measuring 7 x 21mm was noted.  Hysteroscopy with possible polyp resection, D&C recommended as hormonal manipulation has already been done as well as office biopsy.  Procedure, risks and benefits are all discussed in office note from 07/11/15.      Pertinent Gynecological History: Menses: post-menopausal Bleeding: post menopausal bleeding Contraception: none DES exposure: denies Blood transfusions: none Sexually transmitted diseases: no past history Previous GYN Procedures: none  Last mammogram: normal Date: 04/21/15 Last pap: normal Date: 12/28/14 OB History: G0, P0   Menstrual History: Patient's last menstrual period was 10/23/2013.    Past Medical History  Diagnosis Date  . Hx of colonic polyps   . Iliotibial band syndrome of right side 09/2011  . Talipes cavus 02/2010  . At risk for decreased bone density     DEXA 03/2011 normal (-1.0 at R fem)  . Depression     reactive  . Breast nodule 04/1996    left    Past Surgical History  Procedure Laterality Date  . No past surgeries      Family History  Problem Relation Age of Onset  . Prostate cancer Father   . Dementia Father   . Breast cancer Paternal Grandmother   . Osteopenia Mother     Social History:  reports that she has never smoked. She has never used smokeless tobacco. She reports that she drinks about 1.8 - 2.4 oz of alcohol per week. She reports that she does not use illicit drugs.  Allergies:  Allergies  Allergen Reactions  . Codeine Other (See Comments)    Passes out    Review of Systems  All other systems reviewed and are negative.   Height 5\' 3"  (1.6 m), weight 110 lb (49.896 kg), last menstrual period 10/23/2013. Physical Exam  Constitutional:  She appears well-developed and well-nourished.  Cardiovascular: Normal rate and regular rhythm.   Respiratory: Effort normal and breath sounds normal.  Musculoskeletal: Normal range of motion.  Skin: Skin is warm and dry.  Psychiatric: She has a normal mood and affect.    No results found for this or any previous visit (from the past 24 hour(s)).  No results found.  Assessment/Plan: 59 yo G0 MWF here for hysteroscopy with possible polyp resection, D&C due to continued PMP bleeding while on HRT despite manipulation of HRT.  Hale Bogus SUZANNE 07/25/2015, 6:07 AM

## 2015-07-25 NOTE — Transfer of Care (Addendum)
Immediate Anesthesia Transfer of Care Note  Patient: Laura Munoz  Procedure(s) Performed: Procedure(s): DILATATION & CURETTAGE/HYSTEROSCOPY (N/A)  Patient Location: PACU  Anesthesia Type:General  Level of Consciousness: awake, alert , oriented and patient cooperative  Airway & Oxygen Therapy: Patient Spontanous Breathing and Patient connected to nasal cannula oxygen  Post-op Assessment: Report given to RN and Post -op Vital signs reviewed and stable  Post vital signs: Reviewed and stable  Last Vitals:  Filed Vitals:   07/25/15 0917  BP: 127/86  Pulse: 58  Temp: 36.5 C  Resp: 16    Complications: No apparent anesthesia complications but  Eyelids noted to be red where eye tape placed while asleep.  Cold compress placed for pt comfort and Dr. Tresa Moore notified.

## 2015-07-26 ENCOUNTER — Encounter (HOSPITAL_COMMUNITY): Payer: Self-pay | Admitting: Obstetrics & Gynecology

## 2015-07-29 ENCOUNTER — Telehealth: Payer: Self-pay | Admitting: Obstetrics & Gynecology

## 2015-07-29 NOTE — Telephone Encounter (Signed)
Patient calling to check on pathology results from surgery 07/25/15.

## 2015-07-29 NOTE — Telephone Encounter (Signed)
Called patient with results. Discussed result note from Dr. Sabra Heck. Advised of benign pathology.  Patient wanted to know if Dr. Sabra Heck has any further recommendations at this time due to calcifications. Advised that Dr. Sabra Heck would like her to follow up as scheduled. If any additional recommendations she will discuss at that time.   Patient states she is feeling well, no pelvic pain or cramping. Some spotting but reports it has really tapered off.   Confirmed follow up with patient. Advised to call with any concerns prior to scheduled follow up and patient agreeable.  Routing to provider for final review. Patient agreeable to disposition. Will close encounter.

## 2015-07-29 NOTE — Telephone Encounter (Signed)
-----   Message from Megan Salon, MD sent at 07/29/2015  1:03 PM EDT ----- Please inform pt that her pathology showed benign endometrial tissue.  No cancer cells seen.  Has follow up 08/08/15.  She had calcifications inside the endometrial cavity at the time of her procedure.  She had lots of questions about this when I called her the evening of surgery but hopefully I answered them all at that time.

## 2015-08-08 ENCOUNTER — Ambulatory Visit (INDEPENDENT_AMBULATORY_CARE_PROVIDER_SITE_OTHER): Payer: 59 | Admitting: Obstetrics & Gynecology

## 2015-08-08 VITALS — BP 128/86 | HR 68 | Resp 16 | Wt 111.0 lb

## 2015-08-08 DIAGNOSIS — N950 Postmenopausal bleeding: Secondary | ICD-10-CM

## 2015-08-08 NOTE — Progress Notes (Signed)
Post Operative Visit  Procedure: Hysteroscopy, D&C Days Post-op: 13  Subjective: Pt reports she has done well since surgery.  Denies pain, cramping, bleeding, or discharge.  No fever.  Bowel and bladder function is normal. Pathology reviewed with pt.  Questions answered.  Still having some "sticky" discharge.  Wears mini-pad.  Vaginal estrogen cream vs hydrogen peroxide/water douching discussed.  She doesn't think she wants to do anything for this.   Objective: BP 128/86 mmHg  Pulse 68  Resp 16  Wt 111 lb (50.349 kg)  LMP 10/23/2013  EXAM General: alert and cooperative GYN:  NAEFG, vaginal without bleeding or discharge, cervix normal in appearance and closed, no CMT Extremities: extremities normal, atraumatic, no cyanosis or edema Vaginal Bleeding: none  Assessment: s/p hysteroscopy with D&C after having PMP bleeding and calcifications noted at time of procedure  Plan: AEX is planned in early spring 2017.  Pt knows to call with any new bleeding and/or problems.

## 2015-08-09 ENCOUNTER — Encounter: Payer: Self-pay | Admitting: Obstetrics & Gynecology

## 2015-09-08 ENCOUNTER — Encounter: Payer: Self-pay | Admitting: Sports Medicine

## 2015-09-08 ENCOUNTER — Ambulatory Visit (INDEPENDENT_AMBULATORY_CARE_PROVIDER_SITE_OTHER): Payer: 59 | Admitting: Sports Medicine

## 2015-09-08 VITALS — BP 126/81 | HR 75 | Ht 63.0 in | Wt 111.0 lb

## 2015-09-08 DIAGNOSIS — S73192D Other sprain of left hip, subsequent encounter: Secondary | ICD-10-CM | POA: Diagnosis not present

## 2015-09-08 NOTE — Progress Notes (Signed)
  HPI: Laura Munoz is a 59 y.o female with a past medical history significant for low back pain presents with chronic left hip pain present for the past two years.  She states that it has become worse over the past couple of months.  She notes occasional pain with prolonged sitting into the lateral side of her hip into her buttocks and also pain to the same area while exercising.  Unilateral standing exercises and abduction exercises are the most bothersome; pain occurs later in the day not while exercising.  She denies any prior sports injury. Pain is also present when laying on the affected side at night. She is managing the pain with Aleve 1-2 times/week.  She denies any perceived weakness, numbness or tingling.   Review of Systems  All other systems reviewed and are negative. no morning stiffness/ no swelling of joints/ no knee pain Physical Exam: BP 126/81 mmHg  Pulse 75  Ht 5\' 3"  (1.6 m)  Wt 111 lb (50.349 kg)  BMI 19.67 kg/m2  LMP 10/23/2013 Physical Exam  Constitutional: She appears well-developed and well-nourished. No distress.  Musculoskeletal:       Right hip: Normal. She exhibits normal strength and no tenderness.       Left hip: She exhibits decreased strength and tenderness. She exhibits normal range of motion.  Strength, L JZP:HXTAV med 4/5, tensor fascia lata 5/5, glute maximus 5/5, flexion 5/5, internal/external rotation 5/5 Strength R Hip: 5/5 throughout  Palpation: ttp posterior to ASIS ROM Hip: Full active and passive bilaterally    Neurological: She exhibits normal muscle tone.  Special testing: - FABER: negative  -FADIR: negative  Assessment/Plan  Mr./[name] is an otherwise healthy 59 y.o. presenting with left hip pain and physical exam findings consistent with gluteus medius strain secondary to muscle weakness.  - HEP: patient was given abduction/adduction exercises to perform daily to increase her strength  - Relative rest, avoid aggravating  exercises, while strengthening gluteus medius - Follow-up in 6 weeks to reassess muscle strength

## 2015-09-09 DIAGNOSIS — S76019A Strain of muscle, fascia and tendon of unspecified hip, initial encounter: Secondary | ICD-10-CM | POA: Insufficient documentation

## 2015-09-09 DIAGNOSIS — M25552 Pain in left hip: Secondary | ICD-10-CM | POA: Insufficient documentation

## 2015-09-09 DIAGNOSIS — M25752 Osteophyte, left hip: Secondary | ICD-10-CM | POA: Insufficient documentation

## 2015-09-09 NOTE — Assessment & Plan Note (Signed)
She is very active   More of this is muscle imbalance  Reassured no sign of OA  I think she will do well with HEP  Reck 6 wks

## 2015-10-24 ENCOUNTER — Ambulatory Visit (INDEPENDENT_AMBULATORY_CARE_PROVIDER_SITE_OTHER): Payer: 59 | Admitting: Sports Medicine

## 2015-10-24 ENCOUNTER — Encounter: Payer: Self-pay | Admitting: Sports Medicine

## 2015-10-24 VITALS — BP 110/74 | Ht 63.0 in | Wt 111.0 lb

## 2015-10-24 DIAGNOSIS — S73192D Other sprain of left hip, subsequent encounter: Secondary | ICD-10-CM | POA: Diagnosis not present

## 2015-10-24 NOTE — Progress Notes (Signed)
Patient ID: Laura Munoz, female   DOB: 08/23/1956, 59 y.o.   MRN: [id number] Laura Munoz is a 59 year old female who presents to clinic to follow up on her Left posterior hip pain. She states she has been doing somewhat better but still feels the pain over her gluteus medius with sitting long periods of time and with leaning forward. She states her strength has improved 40-50% and has been doing the home physical therapy exercises but has not escalated to the weight exercises. She notes she feels like the pain improves with stretching. She also notes she initially came in because the pain caused her to be up at night but the pain has improved and she is sleeping better now.  Review of Systems: (+) left gluteus maximus pain (-) numbness, tingling, back pain, hip pain, knee pain  Physical Examination BP 110/74 mmHg  Ht 5\' 3"  (1.6 m)  Wt 111 lb (50.349 kg)  BMI 19.67 kg/m2  LMP 10/23/2013  Constitutional: She appears well-developed and well-nourished. No distress.  Musculoskeletal:   Right hip: Normal. 5/5 R hip strength throughout. She exhibits normal strength and no tenderness.   Left hip: She exhibits normal range of motion but does have decreased strength.       L hip strength:glute med 4/5, tensor fascia lata 5/5, glute maximus 5/5, flexion 5/5, internal/external rotation 5/5      Palpation: ttp posterior to ASIS      ROM Hip: Full active and passive bilaterally, FABER and FADIR negative      Neurological: She exhibits normal muscle tone. DTR normal in patella, achilles, and babinski. Pulses 2+ in DP and PT. Sensation with light touch intact.   Assessment/Plan  Laura Munoz is an otherwise healthy 59 y.o. with left gluteus medius syndrome: - HEP: patient was instructed to continue doing the abduction/adduction exercises daily and add 2 pound weights. - Also instructed to do the pretzel stretch and gluteus maximus stretching - Patient instructed she should be improved  in 6 weeks but to call and be seen if has not improved by then. Otherwise, follow-up in February to ensure patient is doing well.

## 2015-10-24 NOTE — Assessment & Plan Note (Signed)
This is improved a great deal from first visit  Keep up HEP  Add a couple of stretches Be more consistent and add some weights for hipexercises  Reck 2 mos

## 2015-10-25 ENCOUNTER — Ambulatory Visit: Payer: 59 | Admitting: Sports Medicine

## 2015-11-14 ENCOUNTER — Encounter: Payer: Self-pay | Admitting: Emergency Medicine

## 2015-11-14 ENCOUNTER — Emergency Department
Admission: EM | Admit: 2015-11-14 | Discharge: 2015-11-14 | Disposition: A | Discharge status: Home / Self Care | Payer: 59 | Source: Home / Self Care | Attending: Family Medicine | Admitting: Family Medicine

## 2015-11-14 DIAGNOSIS — H6983 Other specified disorders of Eustachian tube, bilateral: Secondary | ICD-10-CM

## 2015-11-14 DIAGNOSIS — J069 Acute upper respiratory infection, unspecified: Secondary | ICD-10-CM | POA: Diagnosis not present

## 2015-11-14 DIAGNOSIS — B9789 Other viral agents as the cause of diseases classified elsewhere: Principal | ICD-10-CM

## 2015-11-14 MED ORDER — PREDNISONE 20 MG PO TABS: 20.0000 mg | ORAL_TABLET | Freq: Two times a day (BID) | ORAL | Status: DC

## 2015-11-14 MED ORDER — AMOXICILLIN 875 MG PO TABS: 875.0000 mg | ORAL_TABLET | Freq: Two times a day (BID) | ORAL | Status: DC

## 2015-11-14 NOTE — ED Provider Notes (Signed)
CSN: [id number]     Arrival date & time 11/14/15  1514 History   First MD Initiated Contact with Patient 11/14/15 1607     Chief Complaint  Patient presents with  . Otalgia    ) HPI Comments: One week ago patient developed mild URI symptoms including mild sore throat, sinus congestion, and minimal cough.  Her symptoms have improved and she feels well except for persistent bilateral ear pain and pressure.  She will be flying in two days.   The history is provided by the patient.    Past Medical History  Diagnosis Date  . Hx of colonic polyps   . Iliotibial band syndrome of right side 09/2011  . Talipes cavus 02/2010  . At risk for decreased bone density     DEXA 03/2011 normal (-1.0 at R fem)  . Depression     reactive  . Breast nodule 04/1996    left   Past Surgical History  Procedure Laterality Date  . No past surgeries    . Dilatation & currettage/hysteroscopy with resectocope N/A 07/25/2015    Procedure: DILATATION & CURETTAGE/HYSTEROSCOPY;  Surgeon: Megan Salon, MD;  Location: [facility];  Service: Gynecology;  Laterality: N/A;   Family History  Problem Relation Age of Onset  . Prostate cancer Father   . Dementia Father   . Breast cancer Paternal Grandmother   . Osteopenia Mother    Social History  Substance Use Topics  . Smoking status: Never Smoker   . 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 Alcohol Use: 1.8 - 2.4 oz/week    3-4 Standard drinks or equivalent per week   OB History    Gravida Para Term Preterm AB TAB SAB Ectopic Multiple Living   0 0 0 0 0 0 0 0 0 0      Review of Systems + sore throat + minimal cough No pleuritic pain No wheezing + nasal congestion + post-nasal drainage No sinus pain/pressure No itchy/red eyes ? earache No hemoptysis No SOB No fever/chills No nausea No vomiting No abdominal pain No diarrhea No urinary symptoms No skin rash No fatigue No myalgias No  headache Used OTC meds without relief  Allergies  Codeine  Home Medications   Prior to Admission medications   Medication Sig Start Date End Date Taking? Authorizing Provider  pseudoephedrine (SUDAFED) 30 MG tablet Take 30 mg by mouth every 4 (four) hours as needed for congestion.   Yes Historical Provider, MD  amoxicillin (AMOXIL) 875 MG tablet Take 1 tablet (875 mg total) by mouth 2 (two) times daily. 11/14/15   Kandra Nicolas, MD  Calcium Carbonate-Vit D-Min (CALCIUM 1200 PO) Take by mouth daily.      Historical Provider, MD  estrogen, conjugated,-medroxyprogesterone (PREMPRO) 0.625-2.5 MG per tablet Take 1 tablet by mouth daily. 07/01/15   Megan Salon, MD  Multiple Vitamin (MULTIVITAMIN) tablet Take 1 tablet by mouth daily.      Historical Provider, MD  predniSONE (DELTASONE) 20 MG tablet Take 1 tablet (20 mg total) by mouth 2 (two) times daily. Take with food. 11/14/15   Kandra Nicolas, MD  pyridOXINE (VITAMIN B-6) 100 MG tablet Take 100 mg by mouth daily.    Historical Provider, MD  sertraline (ZOLOFT) 100 MG tablet Take 0.5 tablets (50 mg total) by mouth every other day. As directed 12/28/14   Regina Eck, CNM   Meds Ordered and Administered this Visit  Medications - No data to display  BP 129/88 mmHg  Pulse 81  Temp(Src) 98.2 F (36.8 C) (Oral)  Ht 5\' 3"  (1.6 m)  Wt 110 lb (49.896 kg)  BMI 19.49 kg/m2  SpO2 100%  LMP 10/23/2013 No data found.   Physical Exam Nursing notes and Vital Signs reviewed. Appearance:  Patient appears stated age, and in no acute distress Eyes:  Pupils are equal, round, and reactive to light and accomodation.  Extraocular movement is intact.  Conjunctivae are not inflamed  Ears:  Canals normal.  Tympanic membranes normal.  Nose:  Congested turbinates.  No sinus tenderness.   Pharynx:  Normal Neck:  Supple.   Nontender but enlarged posterior nodes are palpated bilaterally  Lungs:  Clear to auscultation.  Breath sounds are equal.  Moving  air well. Heart:  Regular rate and rhythm without murmurs, rubs, or gallops.  Abdomen:  Nontender without masses or hepatosplenomegaly.  Bowel sounds are present.  No CVA or flank tenderness.  Extremities:  No edema.    Skin:  No rash present.   ED Course  Procedures none    Labs Reviewed -  Tympanogram:  Left ear negative peak pressure; Right ear normal    MDM   1. Viral URI with cough, resolving   2. Eustachian tube dysfunction, bilateral    Begin prednisone burst. Take plain guaifenesin (1200mg  extended release tabs such as Mucinex) twice daily, with plenty of water, for cough and congestion.  May add Pseudoephedrine (30mg , one or two every 4 to 6 hours) for sinus congestion.  Get adequate rest.   May use Afrin nasal spray (or generic oxymetazoline) twice daily for about 5 days and then discontinue.  Also recommend using saline nasal spray several times daily and saline nasal irrigation (AYR is a common brand).  Try warm salt water gargles for sore throat.  Use Afrin nose spray just prior to boarding aircraft. Stop all antihistamines for now. May take Delsym Cough Suppressant at bedtime for nighttime cough.  Begin Amoxicillin if not improving about one week or if persistent fever and/or earache develops Follow-up with family doctor if not improving about10 to 14 days.     Kandra Nicolas, MD 11/23/15 [phone]

## 2015-11-14 NOTE — Discharge Instructions (Signed)
Take plain guaifenesin (1200mg  extended release tabs such as Mucinex) twice daily, with plenty of water, for cough and congestion.  May add Pseudoephedrine (30mg , one or two every 4 to 6 hours) for sinus congestion.  Get adequate rest.   May use Afrin nasal spray (or generic oxymetazoline) twice daily for about 5 days and then discontinue.  Also recommend using saline nasal spray several times daily and saline nasal irrigation (AYR is a common brand).  Try warm salt water gargles for sore throat.  Use Afrin nose spray just prior to boarding aircraft. Stop all antihistamines for now. May take Delsym Cough Suppressant at bedtime for nighttime cough.  Begin Amoxicillin if not improving about one week or if persistent fever and/or earache develops  Follow-up with family doctor if not improving about10 to 14 days.

## 2015-11-14 NOTE — ED Notes (Signed)
Last Monday had sore throat, runny nose, both have resolved, still having bi-lateral ear pain and pressure, flying in 2 days

## 2015-11-29 MED FILL — SERTRALINE HCL 100 MG TAB: 100 | 90 days supply | Qty: 23 | Fill #2

## 2015-12-13 MED FILL — PREMPRO 0.625-2.5 MG TABLET: 0.625-2.5 | 84 days supply | Qty: 84 | Fill #2

## 2016-01-02 ENCOUNTER — Ambulatory Visit: Payer: 59 | Admitting: Obstetrics & Gynecology

## 2016-01-02 ENCOUNTER — Ambulatory Visit: Payer: 59 | Admitting: Certified Nurse Midwife

## 2016-01-09 ENCOUNTER — Ambulatory Visit (INDEPENDENT_AMBULATORY_CARE_PROVIDER_SITE_OTHER): Payer: 59 | Admitting: Obstetrics & Gynecology

## 2016-01-09 ENCOUNTER — Encounter: Payer: Self-pay | Admitting: Obstetrics & Gynecology

## 2016-01-09 VITALS — BP 120/76 | HR 68 | Resp 14 | Ht 62.75 in | Wt 113.0 lb

## 2016-01-09 DIAGNOSIS — N949 Unspecified condition associated with female genital organs and menstrual cycle: Secondary | ICD-10-CM

## 2016-01-09 DIAGNOSIS — N859 Noninflammatory disorder of uterus, unspecified: Secondary | ICD-10-CM

## 2016-01-09 DIAGNOSIS — Z124 Encounter for screening for malignant neoplasm of cervix: Secondary | ICD-10-CM

## 2016-01-09 DIAGNOSIS — Z01419 Encounter for gynecological examination (general) (routine) without abnormal findings: Secondary | ICD-10-CM | POA: Diagnosis not present

## 2016-01-09 DIAGNOSIS — Z0000 Encounter for general adult medical examination without abnormal findings: Secondary | ICD-10-CM

## 2016-01-09 DIAGNOSIS — E2839 Other primary ovarian failure: Secondary | ICD-10-CM | POA: Diagnosis not present

## 2016-01-09 DIAGNOSIS — F32A Depression, unspecified: Secondary | ICD-10-CM

## 2016-01-09 DIAGNOSIS — F329 Major depressive disorder, single episode, unspecified: Secondary | ICD-10-CM | POA: Diagnosis not present

## 2016-01-09 DIAGNOSIS — Z1151 Encounter for screening for human papillomavirus (HPV): Secondary | ICD-10-CM | POA: Diagnosis not present

## 2016-01-09 LAB — POCT URINALYSIS DIPSTICK
BILIRUBIN UA: NEGATIVE
GLUCOSE UA: NEGATIVE
KETONES UA: NEGATIVE
Nitrite, UA: NEGATIVE
PH UA: 6
Protein, UA: NEGATIVE
Urobilinogen, UA: NEGATIVE

## 2016-01-09 LAB — LIPID PANEL
CHOL/HDL RATIO: 2.7 ratio (ref <=5.0)
CHOLESTEROL: 185 mg/dL (ref 125–200)
HDL: 68 mg/dL (ref >=46)
LDL CALC: 90 mg/dL (ref <130)
Triglycerides: 134 mg/dL (ref <150)
VLDL: 27 mg/dL (ref <30)

## 2016-01-09 LAB — HEMOGLOBIN A1C
HEMOGLOBIN A1C: 5.8 % — AB (ref <5.7)
MEAN PLASMA GLUCOSE: 120 mg/dL — AB (ref <117)

## 2016-01-09 LAB — HEMOGLOBIN, FINGERSTICK: Hemoglobin, fingerstick: 12.5 g/dL (ref 12.0–16.0)

## 2016-01-09 MED ORDER — CONJ ESTROG-MEDROXYPROGEST ACE 0.625-2.5 MG PO TABS: 1.0000 | ORAL_TABLET | Freq: Every day | ORAL | Status: DC

## 2016-01-09 MED ORDER — SERTRALINE HCL 100 MG PO TABS: 100.0000 mg | ORAL_TABLET | Freq: Every day | ORAL | Status: DC

## 2016-01-09 NOTE — Progress Notes (Signed)
60 y.o. Laura Munoz MarriedCaucasianF here for annual exam.  Denies vaginal bleeding.  Reports she still has some sticky discharge.  She was treated with antibiotics and steroids over the holidays.    Pt wants to review ultrasound findings and hysteroscopy that was performed 07/25/15.  Pt had episode of PMP on HRT.  Endometrium showed possible polyp.  With hysteroscopy, calcifications were seen and removed.  Pathology was negative.  Pt has not had any bleeding since that time.  I reviewed the literature about calcifications within the endometrium which can be seen with malignancy but pt's curettage pathology was negative.  Pt and I discussed repeat ultrasound to assess endometrium.  That would give Laura Munoz some added reassurance.  Timing discussed.   Patient's last menstrual period was 10/23/2013.          Sexually active: No.  The current method of family planning is post menopausal status.    Exercising: Yes.    Jazzercise 6 x week Smoker:  no  Health Maintenance: Pap:  12/28/14 Neg. 10/17/12 Neg. HR HPV:neg History of abnormal Pap:  no MMG: 04/22/15 BIRADS1:neg Colonoscopy: 11/03/09.  Polypoid mucosa with lymphoid aggregates.  Follow-up 10 years.   BMD:   04/04/11 Normal  TDaP:  10/20/09  Screening Labs: Here, Hb today: 12.5, Urine today: WBC=trace, RBC=trace    reports that she has never smoked. She has never used smokeless tobacco. She reports that she drinks about 1.8 - 2.4 oz of alcohol per week. She reports that she does not use illicit drugs.  Past Medical History  Diagnosis Date  . Hx of colonic polyps   . Iliotibial band syndrome of right side 09/2011  . Talipes cavus 02/2010  . At risk for decreased bone density     DEXA 03/2011 normal (-1.0 at R fem)  . Depression     reactive  . Breast nodule 04/1996    left    Past Surgical History  Procedure Laterality Date  . No past surgeries    . Dilatation & currettage/hysteroscopy with resectocope N/A 07/25/2015    Procedure:  DILATATION & CURETTAGE/HYSTEROSCOPY;  Surgeon: Megan Salon, MD;  Location: [facility];  Service: Gynecology;  Laterality: N/A;    Current Outpatient Prescriptions  Medication Sig Dispense Refill  . Calcium Carbonate-Vit D-Min (CALCIUM 1200 PO) Take by mouth daily.      Marland Kitchen estrogen, conjugated,-medroxyprogesterone (PREMPRO) 0.625-2.5 MG per tablet Take 1 tablet by mouth daily. 90 tablet 2  . Multiple Vitamin (MULTIVITAMIN) tablet Take 1 tablet by mouth daily.      Marland Kitchen pyridOXINE (VITAMIN B-6) 100 MG tablet Take 100 mg by mouth daily.    . sertraline (ZOLOFT) 100 MG tablet Take 0.5 tablets (50 mg total) by mouth every other day. As directed 90 tablet 3   No current facility-administered medications for this visit.    Family History  Problem Relation Age of Onset  . Prostate cancer Father   . Dementia Father   . Breast cancer Paternal Grandmother   . Osteopenia Mother     ROS:  Pertinent items are noted in HPI.  Otherwise, a comprehensive ROS was negative.  Exam:   BP 120/76 mmHg  Pulse 68  Resp 14  Ht 5' 2.75" (1.594 m)  Wt 113 lb (51.256 kg)  BMI 20.17 kg/m2  LMP 10/23/2013  Weight change: +2#  Height: 5' 2.75" (159.4 cm)  Ht Readings from Last 3 Encounters:  01/09/16 5' 2.75" (1.594 m)  11/14/15 5\' 3"  (1.6 m)  10/24/15 5\' 3"  (1.6 m)    General appearance: alert, cooperative and appears stated age Head: Normocephalic, without obvious abnormality, atraumatic Neck: no adenopathy, supple, symmetrical, trachea midline and thyroid normal to inspection and palpation Lungs: clear to auscultation bilaterally Breasts: normal appearance, no masses or tenderness Heart: regular rate and rhythm Abdomen: soft, non-tender; bowel sounds normal; no masses,  no organomegaly Extremities: extremities normal, atraumatic, no cyanosis or edema Skin: Skin color, texture, turgor normal. No rashes or lesions Lymph nodes: Cervical, supraclavicular, and axillary nodes normal. No abnormal inguinal nodes  palpated Neurologic: Grossly normal   Pelvic: External genitalia:  no lesions              Urethra:  normal appearing urethra with no masses, tenderness or lesions              Bartholins and Skenes: normal                 Vagina: normal appearing vagina with normal color and discharge, no lesions              Cervix: no lesions              Pap taken: Yes.   Bimanual Exam:  Uterus:  normal size, contour, position, consistency, mobility, non-tender              Adnexa: normal adnexa and no mass, fullness, tenderness               Rectovaginal: Confirms               Anus:  normal sphincter tone, no lesions  Chaperone was present for exam.   A: Well Woman with normal exam Menopausal on HRT H/O PMP bleeding with hysteroscopy with endometrial calcifications 8/16  P: Mammogram screening discsussed. Will plan DEXA with MMG.  Order placed.  Pt will plan to schedule. Pt desirous to continue HRT.   Discussed risks/benefits of HRT use and [facility] information. Patient continues to feel this is a good choice for her.  Rx for Prem/pro 0.625/2.5mg  daily to pharmacy.  PUS in six months.  Order placed. Continue Zoloft 100mg .  Pt takes 1/2 tab daily.  RF to pharmacy. Labs: Lipids, HbA1C today Pap smear obtained with HR HPV testing. AEX 1 year.  PUS six months.

## 2016-01-09 NOTE — Patient Instructions (Signed)
Prolia.  Every six month injection.  Typically done in office.

## 2016-01-09 NOTE — Progress Notes (Signed)
Scheduled patient while in office for follow up PUS on 07/19/2016 at 1 pm with 1:30 pm consult with Dr.Miller. She is agreeable to date and time.

## 2016-01-11 LAB — IPS PAP TEST WITH HPV

## 2016-01-13 DIAGNOSIS — L812 Freckles: Secondary | ICD-10-CM | POA: Diagnosis not present

## 2016-01-13 DIAGNOSIS — D225 Melanocytic nevi of trunk: Secondary | ICD-10-CM | POA: Diagnosis not present

## 2016-01-13 DIAGNOSIS — L905 Scar conditions and fibrosis of skin: Secondary | ICD-10-CM | POA: Diagnosis not present

## 2016-01-13 DIAGNOSIS — D1801 Hemangioma of skin and subcutaneous tissue: Secondary | ICD-10-CM | POA: Diagnosis not present

## 2016-01-13 DIAGNOSIS — L821 Other seborrheic keratosis: Secondary | ICD-10-CM | POA: Diagnosis not present

## 2016-02-23 MED FILL — SERTRALINE HCL 100 MG TAB: 100 | 90 days supply | Qty: 90 | Fill #0

## 2016-03-05 MED FILL — PREMPRO 0.625-2.5 MG TABLET: 0.625-2.5 | 84 days supply | Qty: 84 | Fill #0

## 2016-03-26 ENCOUNTER — Other Ambulatory Visit: Payer: Self-pay

## 2016-03-26 DIAGNOSIS — Z1231 Encounter for screening mammogram for malignant neoplasm of breast: Secondary | ICD-10-CM

## 2016-05-02 ENCOUNTER — Ambulatory Visit
Admission: RE | Admit: 2016-05-02 | Discharge: 2016-05-02 | Disposition: A | Discharge status: Home / Self Care | Payer: 59 | Source: Ambulatory Visit

## 2016-05-02 ENCOUNTER — Ambulatory Visit
Admission: RE | Admit: 2016-05-02 | Discharge: 2016-05-02 | Disposition: A | Discharge status: Home / Self Care | Payer: 59 | Source: Ambulatory Visit | Attending: Obstetrics & Gynecology | Admitting: Obstetrics & Gynecology

## 2016-05-02 DIAGNOSIS — M85852 Other specified disorders of bone density and structure, left thigh: Secondary | ICD-10-CM | POA: Diagnosis not present

## 2016-05-02 DIAGNOSIS — Z1231 Encounter for screening mammogram for malignant neoplasm of breast: Secondary | ICD-10-CM | POA: Diagnosis not present

## 2016-05-02 DIAGNOSIS — Z780 Asymptomatic menopausal state: Secondary | ICD-10-CM | POA: Diagnosis not present

## 2016-05-02 DIAGNOSIS — E2839 Other primary ovarian failure: Secondary | ICD-10-CM

## 2016-05-08 ENCOUNTER — Encounter: Payer: Self-pay | Admitting: Obstetrics & Gynecology

## 2016-05-10 DIAGNOSIS — H1013 Acute atopic conjunctivitis, bilateral: Secondary | ICD-10-CM | POA: Diagnosis not present

## 2016-06-07 ENCOUNTER — Telehealth: Payer: Self-pay | Admitting: *Deleted

## 2016-06-07 ENCOUNTER — Ambulatory Visit (INDEPENDENT_AMBULATORY_CARE_PROVIDER_SITE_OTHER): Payer: 59 | Admitting: Obstetrics & Gynecology

## 2016-06-07 ENCOUNTER — Ambulatory Visit (INDEPENDENT_AMBULATORY_CARE_PROVIDER_SITE_OTHER): Payer: 59

## 2016-06-07 DIAGNOSIS — N950 Postmenopausal bleeding: Secondary | ICD-10-CM | POA: Diagnosis not present

## 2016-06-07 DIAGNOSIS — N949 Unspecified condition associated with female genital organs and menstrual cycle: Secondary | ICD-10-CM | POA: Diagnosis not present

## 2016-06-07 DIAGNOSIS — N859 Noninflammatory disorder of uterus, unspecified: Secondary | ICD-10-CM

## 2016-06-07 MED ORDER — CONJ ESTROG-MEDROXYPROGEST ACE 0.45-1.5 MG PO TABS: 1.0000 | ORAL_TABLET | Freq: Every day | ORAL | Status: DC

## 2016-06-07 MED FILL — PREMPRO 0.45-1.5 MG TABLET: 0.45-1.5 | 84 days supply | Qty: 84 | Fill #0

## 2016-06-07 NOTE — Telephone Encounter (Signed)
Call to patient. Follow-up on message sent to provider. Left message to call back.

## 2016-06-07 NOTE — Telephone Encounter (Signed)
Patient returned call. Advised of work in appointment for pelvic ultrasound per Dr Sabra Heck today at 1130.   Routing to provider for final review. Patient agreeable to disposition. Will close encounter.

## 2016-06-07 NOTE — Progress Notes (Signed)
60 y.o. Laura Munoz here for a pelvic ultrasound due to post menopausal bleeding.  Pt has experienced a few episodes of this over the past month.  It is just light spotting.  Pt is on HRT with prempro.  Last year, she underwent a hysteroscopy and D&C on 8/16.  Pathology was negative.  She did have calcifications within the endometrium with prior evaluation.  Spouse accompanies her today.    Patient's last menstrual period was 10/23/2013.  Sexually active:  yes  Contraception: PMP  FINDINGS: UTERUS: 6.0 x 3.8 x 2.7cm EMS: 2.106mm, two calcifications noted ADNEXA:   Left ovary 2.4 x 1.0 x 0.8cm   Right ovary 1.7 x 1.0 x 1.3cm CUL DE SAC: no free fluid  Images reviewed with pt.  D/w pt lowering HRT to see if this will resolve the spotting.  With evaluation done less than a year ago and endometrium <41mm, feel ok to monitor and adjust HRT to see if this will resolve issues.  Pt comfortable with plan.  She is going on vacation this week so will wait until she comes back to made adjustment.  Possible dosages discussed.  Questions answered.  Assessment:  PMP bleeding/spotting in pt with hysteroscopy with D&C 8/16 Endometrial calcifications  Plan: Decreased Prem pro to 0.45/1.5mg  dosage, 1 po q day.  #90/4RF Pt will give update in 1 month or let me know if spotting increases  ~15 minutes spent with patient >50% of time was in face to face discussion of above.

## 2016-06-16 ENCOUNTER — Encounter: Payer: Self-pay | Admitting: Obstetrics & Gynecology

## 2016-07-19 ENCOUNTER — Other Ambulatory Visit: Payer: 59 | Admitting: Obstetrics & Gynecology

## 2016-07-19 ENCOUNTER — Other Ambulatory Visit: Payer: 59

## 2016-08-20 MED FILL — PREMPRO 0.45-1.5 MG TABLET: 0.45-1.5 | 84 days supply | Qty: 84 | Fill #1

## 2016-10-16 MED FILL — SERTRALINE HCL 100 MG TAB: 100 | 90 days supply | Qty: 90 | Fill #1

## 2016-11-06 MED FILL — PREMPRO 0.45-1.5 MG TABLET: 0.45-1.5 | 84 days supply | Qty: 84 | Fill #2

## 2016-11-29 ENCOUNTER — Emergency Department (INDEPENDENT_AMBULATORY_CARE_PROVIDER_SITE_OTHER)
Admission: EM | Admit: 2016-11-29 | Discharge: 2016-11-29 | Disposition: A | Discharge status: Home / Self Care | Payer: 59 | Source: Home / Self Care | Attending: Family Medicine | Admitting: Family Medicine

## 2016-11-29 ENCOUNTER — Encounter: Payer: Self-pay | Admitting: *Deleted

## 2016-11-29 DIAGNOSIS — J069 Acute upper respiratory infection, unspecified: Secondary | ICD-10-CM

## 2016-11-29 DIAGNOSIS — B9789 Other viral agents as the cause of diseases classified elsewhere: Secondary | ICD-10-CM

## 2016-11-29 MED ORDER — IPRATROPIUM BROMIDE 0.06 % NA SOLN: 2.0000 | Freq: Four times a day (QID) | NASAL | 0 refills | Status: DC

## 2016-11-29 MED ORDER — AZITHROMYCIN 250 MG PO TABS: 250.0000 mg | ORAL_TABLET | Freq: Every day | ORAL | 0 refills | Status: DC

## 2016-11-29 MED ORDER — BENZONATATE 100 MG PO CAPS: 100.0000 mg | ORAL_CAPSULE | Freq: Three times a day (TID) | ORAL | 0 refills | Status: DC

## 2016-11-29 NOTE — Discharge Instructions (Signed)
°  Your symptoms are likely due to a virus such as the common cold, however, if you developing worsening chest congestion with shortness of breath, persistent fever for 3 days, or symptoms not improving in 4-5 days, you may fill the antibiotic (azithromycin).  If you do fill the antibiotic,  please take antibiotics as prescribed and be sure to complete entire course even if you start to feel better to ensure infection does not come back.  For the Ipratropium nasal spray, be sure to use as prescribed to help prevent post-nasal drip, which can trigger coughing, especially at night.  Use 2 sprays per nostril 4 times daily while sick.  You should spray one time in each nostril pointing the spray to the out portion of your nostril, breath in slowly while spraying. Wait about 30 seconds to 1 minute before given the second spray in each nostril.  This will ensure you get the most benefit from each spray.   If the nasal spray dries out your nose or sinuses too much, you may try cutting back to 1-2 times daily for 1 week.

## 2016-11-29 NOTE — ED Provider Notes (Signed)
CSN: [id number]     Arrival date & time 11/29/16  1001 History   First MD Initiated Contact with Patient 11/29/16 1030     Chief Complaint  Patient presents with  . Cough  . Fatigue  . Nasal Congestion   (Consider location/radiation/quality/duration/timing/severity/associated sxs/prior Treatment) HPI Laura Munoz is a 61 y.o. female presenting to UC with c/o subjective fever, dizziness, congestin and fatigue about 8 days ago while in [place] with family.  The dizziness and fever have since resolved, however, she has been having a mild to moderately intermittent minimally productive cough, nasal congestion, generalized headache and fatigue.  She was able to go the the gym the last 2 days but feels more worn out than normal.  She has been taking Delsym without relief.  Denies n/v/d. Denies chest pain or SOB. No hx of asthma. She did get the flu vaccine this season. She notes family she was with last week was also sick but seemed to have gotten over their symptoms much faster.   Past Medical History:  Diagnosis Date  . At risk for decreased bone density    DEXA 03/2011 normal (-1.0 at R fem)  . Breast nodule 04/1996   left  . Depression    reactive  . Hx of colonic polyps   . Iliotibial band syndrome of right side 09/2011  . Myalgia    back and hips   . Talipes cavus 02/2010   Past Surgical History:  Procedure Laterality Date  . DILATATION & CURRETTAGE/HYSTEROSCOPY WITH RESECTOCOPE N/A 07/25/2015   Procedure: DILATATION & CURETTAGE/HYSTEROSCOPY;  Surgeon: Megan Salon, MD;  Location: [facility];  Service: Gynecology;  Laterality: N/A;  . NO PAST SURGERIES     Family History  Problem Relation Age of Onset  . Prostate cancer Father   . Dementia Father   . Osteopenia Mother   . Breast cancer Paternal Grandmother    Social History  Substance Use Topics  . Smoking status: Never Smoker  . 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 Alcohol use 1.8 - 2.4 oz/week    3 - 4 Standard drinks or equivalent per week   OB History    Gravida Para Term Preterm AB Living   0 0 0 0 0 0   SAB TAB Ectopic Multiple Live Births   0 0 0 0       Review of Systems  Constitutional: Positive for fatigue and fever ( subjective). Negative for chills.  HENT: Positive for congestion, postnasal drip, rhinorrhea and sinus pressure. Negative for ear pain, sinus pain, sore throat, trouble swallowing and voice change.   Respiratory: Positive for cough. Negative for shortness of breath.   Cardiovascular: Negative for chest pain and palpitations.  Gastrointestinal: Negative for abdominal pain, diarrhea, nausea and vomiting.  Musculoskeletal: Positive for arthralgias and myalgias. Negative for back pain.  Skin: Negative for rash.  Neurological: Positive for dizziness and headaches. Negative for syncope, weakness and light-headedness.  All other systems reviewed and are negative.   Allergies  Chocolate and Codeine  Home Medications   Prior to Admission medications   Medication Sig Start Date End Date Taking? Authorizing Provider  Calcium Carbonate-Vit D-Min (CALCIUM 1200 PO) Take by mouth daily.     Yes Historical Provider, MD  Multiple Vitamin (MULTIVITAMIN) tablet Take 1 tablet by mouth daily.     Yes Historical Provider, MD  pyridOXINE (VITAMIN B-6) 100 MG tablet Take  100 mg by mouth daily.   Yes Historical Provider, MD  sertraline (ZOLOFT) 100 MG tablet Take 1 tablet (100 mg total) by mouth daily. 01/09/16  Yes Megan Salon, MD  azithromycin (ZITHROMAX) 250 MG tablet Take 1 tablet (250 mg total) by mouth daily. Take first 2 tablets together, then 1 every day until finished. 11/29/16   Noland Fordyce, PA-C  benzonatate (TESSALON) 100 MG capsule Take 1-2 capsules (100-200 mg total) by mouth every 8 (eight) hours. 11/29/16   Noland Fordyce, PA-C  estrogen, conjugated,-medroxyprogesterone (PREMPRO) 0.45-1.5 MG tablet Take 1 tablet by mouth  daily. 06/07/16   Megan Salon, MD  ipratropium (ATROVENT) 0.06 % nasal spray Place 2 sprays into both nostrils 4 (four) times daily. 11/29/16   Noland Fordyce, PA-C   Meds Ordered and Administered this Visit  Medications - No data to display  BP 137/81 (BP Location: Left Arm)   Pulse 75   Temp 98.2 F (36.8 C) (Oral)   Resp 16   Wt 108 lb (49 kg)   LMP 10/23/2013   SpO2 99%   BMI 19.28 kg/m  No data found.   Physical Exam  Constitutional: She appears well-developed and well-nourished. No distress.  HENT:  Head: Normocephalic and atraumatic.  Right Ear: Tympanic membrane normal.  Left Ear: Tympanic membrane normal.  Nose: Mucosal edema present. Right sinus exhibits no maxillary sinus tenderness and no frontal sinus tenderness. Left sinus exhibits no maxillary sinus tenderness and no frontal sinus tenderness.  Mouth/Throat: Uvula is midline and mucous membranes are normal. Posterior oropharyngeal erythema present. No oropharyngeal exudate, posterior oropharyngeal edema or tonsillar abscesses.  Eyes: Conjunctivae are normal. No scleral icterus.  Neck: Normal range of motion. Neck supple.  Cardiovascular: Normal rate, regular rhythm and normal heart sounds.   Pulmonary/Chest: Effort normal and breath sounds normal. No stridor. No respiratory distress. She has no wheezes. She has no rales.  Musculoskeletal: Normal range of motion.  Lymphadenopathy:    She has no cervical adenopathy.  Neurological: She is alert.  Skin: Skin is warm and dry. She is not diaphoretic.  Nursing note and vitals reviewed.   Urgent Care Course   Clinical Course     Procedures (including critical care time)  Labs Review Labs Reviewed - No data to display  Imaging Review No results found.    MDM   1. Viral URI with cough    Pt c/o 8 days of cough and congestion that initially started as dizziness and subjective fever.  Symptoms could be due to influenza given body aches and time of year,  however, pt did have flu vaccine and is 8 days out.  Tamiflu would likely not be of benefit at this time.   O2 Sat 99% on RA. Pt is afebrile. Lungs: CTAB Symptoms likely viral in nature, however, due to recent travel and persistence of symptoms, will prescribe Azithromycin for pt to take to cover for potential bacterial infection. May try symptomatic treatment for another few days, however, if not improving in 4-5 days or persistent fever, encouraged to start Azithromycin if she hadn't already.  Rx: Tessalon and ipratropium, Azithromycin  F/u with PCP in 5-7 days if not improving. Patient verbalized understanding and agreement with treatment plan.      Noland Fordyce, PA-C 11/29/16 1226

## 2016-11-29 NOTE — ED Triage Notes (Signed)
Patient c/o fever and dizziness that started 8 days ago, they have since resolved but now has deep non-productive cough, nasal congestion, HA and fatigue. Taken Delsym without relief.

## 2016-12-02 ENCOUNTER — Telehealth: Payer: Self-pay | Admitting: Emergency Medicine

## 2016-12-02 NOTE — Telephone Encounter (Signed)
Inquired about patient's status; encourage them to call with questions/concerns.  

## 2017-01-08 MED FILL — SERTRALINE HCL 100 MG TAB: 100 | 90 days supply | Qty: 90 | Fill #2

## 2017-01-11 DIAGNOSIS — L918 Other hypertrophic disorders of the skin: Secondary | ICD-10-CM | POA: Diagnosis not present

## 2017-01-11 DIAGNOSIS — D1801 Hemangioma of skin and subcutaneous tissue: Secondary | ICD-10-CM | POA: Diagnosis not present

## 2017-01-11 DIAGNOSIS — D235 Other benign neoplasm of skin of trunk: Secondary | ICD-10-CM | POA: Diagnosis not present

## 2017-01-11 DIAGNOSIS — L821 Other seborrheic keratosis: Secondary | ICD-10-CM | POA: Diagnosis not present

## 2017-02-18 MED FILL — PREMPRO 0.45-1.5 MG TABLET: 0.45-1.5 | 84 days supply | Qty: 84 | Fill #3

## 2017-03-26 ENCOUNTER — Other Ambulatory Visit: Payer: Self-pay | Admitting: Obstetrics & Gynecology

## 2017-03-26 DIAGNOSIS — Z1231 Encounter for screening mammogram for malignant neoplasm of breast: Secondary | ICD-10-CM

## 2017-04-04 NOTE — Progress Notes (Signed)
61 y.o. G0P0000 MarriedCaucasianF here for annual exam.  Doing well.  No vaginal bleeding.    Reports her mother moved here from [place], [place].  This has been stressful.  Has lost six pounds.  Working to gain it back.  H/O mildly elevated HbA1C.  Pt exercises every day.  Careful with diet.  Happy with HRT dosing.  Wants to discuss when to decrease dosage.  Would like to stay the same right now considering life stressors.  Patient's last menstrual period was 10/23/2013.          Sexually active: No.  The current method of family planning is post menopausal status.    Exercising: Yes.    jazzercise 6 x weekly  Smoker:  no  Health Maintenance: Pap:  01/09/16 Neg. HR HPV:neg   12/28/14 Neg   History of abnormal Pap:  no MMG:  05/02/16 BIRADS1:neg  Colonoscopy:  11/03/09 Polyps. f/u 10 years  BMD:   05/02/16 Mild Osteopenia  TDaP:  09/2009  Pneumonia vaccine(s):  No Zostavax:   No Hep C testing: No Screening Labs: Here, Hb today: 13.0, Urine today: Not collected     reports that she has never smoked. She has never used smokeless tobacco. She reports that she drinks about 1.8 - 2.4 oz of alcohol per week . She reports that she does not use drugs.  Past Medical History:  Diagnosis Date  . At risk for decreased bone density    DEXA 03/2011 normal (-1.0 at R fem)  . Breast nodule 04/1996   left  . Depression    reactive  . Hx of colonic polyps   . Iliotibial band syndrome of right side 09/2011  . Myalgia    back and hips   . Talipes cavus 02/2010    Past Surgical History:  Procedure Laterality Date  . DILATATION & CURRETTAGE/HYSTEROSCOPY WITH RESECTOCOPE N/A 07/25/2015   Procedure: DILATATION & CURETTAGE/HYSTEROSCOPY;  Surgeon: Megan Salon, MD;  Location: [facility];  Service: Gynecology;  Laterality: N/A;  . NO PAST SURGERIES      Current Outpatient Prescriptions  Medication Sig Dispense Refill  . Calcium Carbonate-Vit D-Min (CALCIUM 1200 PO) Take by mouth daily.      Marland Kitchen estrogen,  conjugated,-medroxyprogesterone (PREMPRO) 0.45-1.5 MG tablet Take 1 tablet by mouth daily. 90 tablet 4  . Multiple Vitamin (MULTIVITAMIN) tablet Take 1 tablet by mouth daily.      Marland Kitchen pyridOXINE (VITAMIN B-6) 100 MG tablet Take 100 mg by mouth daily.    . sertraline (ZOLOFT) 100 MG tablet Take 1 tablet (100 mg total) by mouth daily. (Patient taking differently: Take 50 mg by mouth every other day. ) 90 tablet 4   No current facility-administered medications for this visit.     Family History  Problem Relation Age of Onset  . Prostate cancer Father   . Dementia Father   . Osteopenia Mother   . Breast cancer Paternal Grandmother     ROS:  Pertinent items are noted in HPI.  Otherwise, a comprehensive ROS was negative.  Exam:   BP 119/86 (BP Location: Right Arm, Patient Position: Sitting, Cuff Size: Normal)   Pulse 70   Resp 16   Ht 5' 2.5" (1.588 m)   Wt 107 lb (48.5 kg)   LMP 10/23/2013   BMI 19.26 kg/m   Weight change: -6#    Height: 5' 2.5" (158.8 cm)  Ht Readings from Last 3 Encounters:  04/05/17 5' 2.5" (1.588 m)  01/09/16 5' 2.75" (1.594  m)  11/14/15 5\' 3"  (1.6 m)    General appearance: alert, cooperative and appears stated age Head: Normocephalic, without obvious abnormality, atraumatic Neck: no adenopathy, supple, symmetrical, trachea midline and thyroid normal to inspection and palpation Lungs: clear to auscultation bilaterally Breasts: normal appearance, no masses or tenderness Heart: regular rate and rhythm Abdomen: soft, non-tender; bowel sounds normal; no masses,  no organomegaly Extremities: extremities normal, atraumatic, no cyanosis or edema Skin: Skin color, texture, turgor normal. No rashes or lesions Lymph nodes: Cervical, supraclavicular, and axillary nodes normal. No abnormal inguinal nodes palpated Neurologic: Grossly normal   Pelvic: External genitalia:  no lesions              Urethra:  normal appearing urethra with no masses, tenderness or lesions               Bartholins and Skenes: normal                 Vagina: normal appearing vagina with normal color and discharge, no lesions              Cervix: no lesions              Pap taken: Yes.   Bimanual Exam:  Uterus:  normal size, contour, position, consistency, mobility, non-tender              Adnexa: normal adnexa and no mass, fullness, tenderness               Rectovaginal: Confirms               Anus:  normal sphincter tone, no lesions  Chaperone was present for exam.  A:  Well Woman with normal exam PMP, on HRT Weight loss that patient thinks is from the stress of her mother's move  P:   Mammogram screening discussed pap smear only today.  Neg HR HPV 2017.  Aware of guidelines but she desires yearly pap smear screening Will continue HRT 0.45/1.5mg  daily.  #90/4RF Zoloft 100mg  1/2 tab every other day.  #90/4RF Hemoglobin A1C and Hep C antibody obtained today. Return annually or prn

## 2017-04-05 ENCOUNTER — Encounter: Payer: Self-pay | Admitting: Obstetrics & Gynecology

## 2017-04-05 ENCOUNTER — Other Ambulatory Visit (HOSPITAL_COMMUNITY)
Admission: RE | Admit: 2017-04-05 | Discharge: 2017-04-05 | Disposition: A | Discharge status: Home / Self Care | Payer: 59 | Source: Ambulatory Visit | Attending: Obstetrics & Gynecology | Admitting: Obstetrics & Gynecology

## 2017-04-05 ENCOUNTER — Ambulatory Visit (INDEPENDENT_AMBULATORY_CARE_PROVIDER_SITE_OTHER): Payer: 59 | Admitting: Obstetrics & Gynecology

## 2017-04-05 VITALS — BP 102/66 | HR 70 | Resp 16 | Ht 62.5 in | Wt 107.0 lb

## 2017-04-05 DIAGNOSIS — Z0000 Encounter for general adult medical examination without abnormal findings: Secondary | ICD-10-CM | POA: Diagnosis not present

## 2017-04-05 DIAGNOSIS — Z01419 Encounter for gynecological examination (general) (routine) without abnormal findings: Secondary | ICD-10-CM

## 2017-04-05 DIAGNOSIS — Z205 Contact with and (suspected) exposure to viral hepatitis: Secondary | ICD-10-CM | POA: Diagnosis not present

## 2017-04-05 DIAGNOSIS — R7309 Other abnormal glucose: Secondary | ICD-10-CM

## 2017-04-05 DIAGNOSIS — Z124 Encounter for screening for malignant neoplasm of cervix: Secondary | ICD-10-CM

## 2017-04-05 LAB — HEMOGLOBIN, FINGERSTICK: HEMOGLOBIN, FINGERSTICK: 13 g/dL (ref 12.0–15.0)

## 2017-04-05 MED ORDER — SERTRALINE HCL 100 MG PO TABS: 100.0000 mg | ORAL_TABLET | Freq: Every day | ORAL | 4 refills | Status: DC

## 2017-04-05 MED ORDER — CONJ ESTROG-MEDROXYPROGEST ACE 0.45-1.5 MG PO TABS: 1.0000 | ORAL_TABLET | Freq: Every day | ORAL | 4 refills | Status: DC

## 2017-04-05 MED FILL — SERTRALINE HCL 100 MG TAB: 100 | 90 days supply | Qty: 90 | Fill #0

## 2017-04-06 LAB — HEPATITIS C ANTIBODY: HCV AB: NEGATIVE

## 2017-04-06 LAB — HEMOGLOBIN A1C
Hgb A1c MFr Bld: 5.3 % (ref <5.7)
MEAN PLASMA GLUCOSE: 105 mg/dL

## 2017-04-08 ENCOUNTER — Encounter: Payer: Self-pay | Admitting: Obstetrics & Gynecology

## 2017-04-09 DIAGNOSIS — Z0101 Encounter for examination of eyes and vision with abnormal findings: Secondary | ICD-10-CM | POA: Diagnosis not present

## 2017-04-09 LAB — CYTOLOGY - PAP
Diagnosis: NEGATIVE
HPV: NOT DETECTED

## 2017-04-23 ENCOUNTER — Ambulatory Visit: Payer: 59 | Admitting: Obstetrics & Gynecology

## 2017-04-29 MED FILL — PREMPRO 0.45-1.5 MG TABLET: 0.45-1.5 | 84 days supply | Qty: 84 | Fill #4

## 2017-05-03 ENCOUNTER — Ambulatory Visit
Admission: RE | Admit: 2017-05-03 | Discharge: 2017-05-03 | Disposition: A | Discharge status: Home / Self Care | Payer: 59 | Source: Ambulatory Visit | Attending: Obstetrics & Gynecology | Admitting: Obstetrics & Gynecology

## 2017-05-03 DIAGNOSIS — Z1231 Encounter for screening mammogram for malignant neoplasm of breast: Secondary | ICD-10-CM

## 2017-07-19 ENCOUNTER — Telehealth: Payer: Self-pay | Admitting: Obstetrics & Gynecology

## 2017-07-19 MED FILL — PREMPRO 0.45-1.5 MG TABLET: 0.45-1.5 | 84 days supply | Qty: 84 | Fill #0

## 2017-07-19 NOTE — Telephone Encounter (Signed)
Call to [facility], spoke with Anderson Malta. Rx for Prempro on file, with 4 refills, cost for 84 tablets $102.70.

## 2017-07-19 NOTE — Telephone Encounter (Signed)
Spoke with patient, advised RX for prempro on file on [facility], f/u for filling. Patient thankful for f/u and verbalizes understanding.  Patient is agreeable to disposition. Will close encounter.

## 2017-07-19 NOTE — Telephone Encounter (Signed)
Patient says she called pharmacy to check on new prescription for Prempro but they are not showing a new one in their system. [facility] at [phone].

## 2017-07-28 ENCOUNTER — Emergency Department
Admission: EM | Admit: 2017-07-28 | Discharge: 2017-07-28 | Disposition: A | Discharge status: Home / Self Care | Payer: 59 | Source: Home / Self Care | Attending: Family Medicine | Admitting: Family Medicine

## 2017-07-28 ENCOUNTER — Encounter: Payer: Self-pay | Admitting: Emergency Medicine

## 2017-07-28 DIAGNOSIS — S61212A Laceration without foreign body of right middle finger without damage to nail, initial encounter: Secondary | ICD-10-CM

## 2017-07-28 NOTE — Discharge Instructions (Signed)
Keep wound clean and dry.  Return for any signs of infection (or follow-up with family doctor):  Increasing redness, swelling, pain, heat, drainage, etc. °Follow instructions on Dermabond information sheet.  °

## 2017-07-28 NOTE — ED Triage Notes (Signed)
Pt states she cut her right middle finger on a razor at home about 2 hours ago. She cleaned it with soap and water. Last tetanus 2010.

## 2017-07-28 NOTE — ED Provider Notes (Signed)
[name]    CSN: [id number] Arrival date & time: 07/28/17  1441     History   Chief Complaint Chief Complaint  Patient presents with  . Laceration    HPI Laura Munoz is a 61 y.o. female.   Patient lacerated the tip of her right middle finger on a razor at home about 2 hours ago.  She cleaned it with soap and water.  Last Tdap was in 2010.   The history is provided by the patient.  Laceration  Location:  Finger Finger laceration location:  R middle finger Length:  21mm Depth:  Through dermis Quality: straight   Bleeding: controlled   Time since incident:  2 hours Laceration mechanism:  Razor Pain details:    Quality:  Aching   Severity:  Mild   Timing:  Constant   Progression:  Unchanged Foreign body present:  No foreign bodies Relieved by:  Nothing Worsened by:  Movement Ineffective treatments:  None tried Tetanus status:  Up to date   Past Medical History:  Diagnosis Date  . At risk for decreased bone density    DEXA 03/2011 normal (-1.0 at R fem)  . Breast nodule 04/1996   left  . Depression    reactive  . Hx of colonic polyps   . Iliotibial band syndrome of right side 09/2011  . Myalgia    back and hips   . Talipes cavus 02/2010    Patient Active Problem List   Diagnosis Date Noted  . 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 Postmenopausal HRT (hormone replacement therapy) 01/28/2015  . Low back pain radiating to left lower extremity 08/03/2014  . Tenosynovitis, de Quervain 01/21/2013  . Calf pain 12/02/2012  . Situational mixed anxiety and depressive disorder 04/10/2012  . Iliotibial band syndrome of right side 10/09/2011  . TALIPES CAVUS 03/23/2010    Past Surgical History:  Procedure Laterality Date  . DILATATION & CURRETTAGE/HYSTEROSCOPY WITH RESECTOCOPE N/A 07/25/2015   Procedure: DILATATION & CURETTAGE/HYSTEROSCOPY;  Surgeon: Megan Salon, MD;  Location: [facility];  Service: Gynecology;  Laterality: N/A;  . NO PAST  SURGERIES      OB History    Gravida Para Term Preterm AB Living   0 0 0 0 0 0   SAB TAB Ectopic Multiple Live Births   0 0 0 0         Home Medications    Prior to Admission medications   Medication Sig Start Date End Date Taking? Authorizing Provider  Calcium Carbonate-Vit D-Min (CALCIUM 1200 PO) Take by mouth daily.      [provider]  estrogen, conjugated,-medroxyprogesterone (PREMPRO) 0.45-1.5 MG tablet Take 1 tablet by mouth daily. 04/05/17   Megan Salon, MD  Multiple Vitamin (MULTIVITAMIN) tablet Take 1 tablet by mouth daily.      [provider]  pyridOXINE (VITAMIN B-6) 100 MG tablet Take 100 mg by mouth daily.    [provider]  sertraline (ZOLOFT) 100 MG tablet Take 1 tablet (100 mg total) by mouth daily. 04/05/17   Megan Salon, MD    Family History Family History  Problem Relation Age of Onset  . Prostate cancer Father   . Dementia Father   . Osteopenia Mother   . Breast cancer Paternal Grandmother     Social History Social History  Substance Use Topics  . Smoking status: Never Smoker  . 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 Alcohol use 1.8 - 2.4 oz/week    3 - 4 Standard drinks or equivalent per week     Allergies   Chocolate and Codeine   Review of Systems Review of Systems  All other systems reviewed and are negative.    Physical Exam Triage Vital Signs ED Triage Vitals  Enc Vitals Group     BP 07/28/17 1507 121/86     Pulse Rate 07/28/17 1507 66     Resp --      Temp 07/28/17 1507 97.8 F (36.6 C)     Temp Source 07/28/17 1507 Oral     SpO2 07/28/17 1507 100 %     Weight 07/28/17 1508 108 lb (49 kg)     Height --      Head Circumference --      Peak Flow --      Pain Score 07/28/17 1508 0     Pain Loc --      Pain Edu? --      Excl. in [place]? --    No data found.   Updated Vital Signs BP 121/86 (BP Location: Right Arm)   Pulse 66    Temp 97.8 F (36.6 C) (Oral)   Wt 108 lb (49 kg)   LMP 10/23/2013   SpO2 100%   BMI 19.44 kg/m   Visual Acuity Right Eye Distance:   Left Eye Distance:   Bilateral Distance:    Right Eye Near:   Left Eye Near:    Bilateral Near:     Physical Exam  Constitutional: She appears well-developed and well-nourished. No distress.  HENT:  Head: Normocephalic.  Eyes: Pupils are equal, round, and reactive to light.  Cardiovascular: Regular rhythm.   Pulmonary/Chest: Effort normal.  Musculoskeletal:       Right hand: She exhibits laceration. She exhibits normal range of motion, no tenderness, normal capillary refill and no swelling.       Hands: Tip of right middle finger has a simple linear 60mm laceration as noted on diagram.   Neurological: She is alert.  Skin: Skin is warm and dry.  Nursing note and vitals reviewed.    UC Treatments / Results  Labs (all labs ordered are listed, but only abnormal results are displayed) Labs Reviewed - No data to display  EKG  EKG Interpretation None       Radiology No results found.  Procedures Procedures  Laceration Repair (Dermabond) Discussed benefits and risks of procedure and verbal consent obtained. Using sterile technique, cleansed wound with HibiClens followed by copious lavage with normal saline.  Wound carefully inspected for debris and foreign bodies; none found.  Wound edges carefully approximated in normal anatomic position and closed with Dermabond.  Wound precautions explained to patient.     Medications Ordered in UC Medications - No data to display   Initial Impression / Assessment and Plan / UC Course  I have reviewed the triage vital signs and the nursing notes.  Pertinent labs & imaging results that were available during my care of the patient were reviewed by me and considered in my medical decision making (see chart for details).    Keep wound clean and dry.  Return for any signs of infection (or follow-up  with family doctor):  Increasing redness, swelling, pain, heat, drainage, etc. Follow instructions on Dermabond information sheet.     Final Clinical Impressions(s) / UC Diagnoses   Final diagnoses:  Laceration of right middle finger without  foreign body without damage to nail, initial encounter    New Prescriptions New Prescriptions   No medications on file         Kandra Nicolas, MD 08/04/17 1710

## 2017-10-11 MED FILL — PREMPRO 0.45-1.5 MG TABLET: 0.45-1.5 | 84 days supply | Qty: 84 | Fill #1

## 2017-11-25 ENCOUNTER — Emergency Department
Admission: EM | Admit: 2017-11-25 | Discharge: 2017-11-25 | Disposition: A | Discharge status: Home / Self Care | Payer: 59 | Source: Home / Self Care | Attending: Family Medicine | Admitting: Family Medicine

## 2017-11-25 DIAGNOSIS — B9789 Other viral agents as the cause of diseases classified elsewhere: Secondary | ICD-10-CM

## 2017-11-25 DIAGNOSIS — J069 Acute upper respiratory infection, unspecified: Secondary | ICD-10-CM | POA: Diagnosis not present

## 2017-11-25 DIAGNOSIS — H6983 Other specified disorders of Eustachian tube, bilateral: Secondary | ICD-10-CM | POA: Diagnosis not present

## 2017-11-25 MED ORDER — AMOXICILLIN 875 MG PO TABS: 875.0000 mg | ORAL_TABLET | Freq: Two times a day (BID) | ORAL | 0 refills | Status: DC

## 2017-11-25 MED ORDER — PREDNISONE 20 MG PO TABS: ORAL_TABLET | ORAL | 0 refills | Status: DC

## 2017-11-25 NOTE — ED Provider Notes (Signed)
[facility]    CSN: [id number] Arrival date & time: 11/25/17  1017     History   Chief Complaint Chief Complaint  Patient presents with  . Headache    sinus  . Facial Pain  . Nasal Congestion    HPI Laura Munoz is a 61 y.o. female.   Patient reports that she developed a sore throat and sinus congestion about two weeks ago.  She seemed to improve, then developed increased symptoms about one week ago including a cough.  She complains of persistent ear fullness without pain.  No recent fevers, chills, and sweats.   The history is provided by the patient.    Past Medical History:  Diagnosis Date  . At risk for decreased bone density    DEXA 03/2011 normal (-1.0 at R fem)  . Breast nodule 04/1996   left  . Depression    reactive  . Hx of colonic polyps   . Iliotibial band syndrome of right side 09/2011  . Myalgia    back and hips   . Talipes cavus 02/2010    Patient Active Problem List   Diagnosis Date Noted  . 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 Postmenopausal HRT (hormone replacement therapy) 01/28/2015  . Low back pain radiating to left lower extremity 08/03/2014  . Tenosynovitis, de Quervain 01/21/2013  . Calf pain 12/02/2012  . Situational mixed anxiety and depressive disorder 04/10/2012  . Iliotibial band syndrome of right side 10/09/2011  . TALIPES CAVUS 03/23/2010    Past Surgical History:  Procedure Laterality Date  . DILATATION & CURRETTAGE/HYSTEROSCOPY WITH RESECTOCOPE N/A 07/25/2015   Procedure: DILATATION & CURETTAGE/HYSTEROSCOPY;  Surgeon: Megan Salon, MD;  Location: [facility];  Service: Gynecology;  Laterality: N/A;  . NO PAST SURGERIES      OB History    Gravida Para Term Preterm AB Living   0 0 0 0 0 0   SAB TAB Ectopic Multiple Live Births   0 0 0 0         Home Medications    Prior to Admission medications   Medication Sig Start Date End Date Taking? Authorizing Provider  amoxicillin (AMOXIL) 875 MG  tablet Take 1 tablet (875 mg total) by mouth 2 (two) times daily. 11/25/17   Kandra Nicolas, MD  Calcium Carbonate-Vit D-Min (CALCIUM 1200 PO) Take by mouth daily.      [provider]  estrogen, conjugated,-medroxyprogesterone (PREMPRO) 0.45-1.5 MG tablet Take 1 tablet by mouth daily. 04/05/17   Megan Salon, MD  Multiple Vitamin (MULTIVITAMIN) tablet Take 1 tablet by mouth daily.      [provider]  predniSONE (DELTASONE) 20 MG tablet Take one tab by mouth twice daily for 5 days, then one daily for 3 days. Take with food. 11/25/17   Kandra Nicolas, MD  pyridOXINE (VITAMIN B-6) 100 MG tablet Take 100 mg by mouth daily.    [provider]  sertraline (ZOLOFT) 100 MG tablet Take 1 tablet (100 mg total) by mouth daily. 04/05/17   Megan Salon, MD    Family History Family History  Problem Relation Age of Onset  . Prostate cancer Father   . Dementia Father   . Osteopenia Mother   . Breast cancer Paternal Grandmother     Social History Social History   Tobacco Use  . Smoking status: Never Smoker  . Smokeless tobacco: Never Used  . 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 Alcohol use: Yes    Alcohol/week: 1.8 - 2.4 oz    Types: 3 - 4 Standard drinks or equivalent per week  . Drug use: No     Allergies   Chocolate and Codeine   Review of Systems Review of Systems + sore throat, resolved + cough No pleuritic pain No wheezing + nasal congestion + post-nasal drainage + sinus pain/pressure No itchy/red eyes ? earache No hemoptysis No SOB No fever/chills No nausea No vomiting No abdominal pain No diarrhea No urinary symptoms No skin rash No fatigue No myalgias + headache Used OTC meds without relief   Physical Exam Triage Vital Signs ED Triage Vitals  Enc Vitals Group     BP 11/25/17 1100 123/77     Pulse Rate 11/25/17 1100 71     Resp --      Temp 11/25/17 1100 98.6 F  (37 C)     Temp Source 11/25/17 1100 Oral     SpO2 11/25/17 1100 100 %     Weight 11/25/17 1101 108 lb (49 kg)     Height 11/25/17 1101 5\' 3"  (1.6 m)     Head Circumference --      Peak Flow --      Pain Score 11/25/17 1101 3     Pain Loc --      Pain Edu? --      Excl. in [place]? --    No data found.  Updated Vital Signs BP 123/77 (BP Location: Right Arm)   Pulse 71   Temp 98.6 F (37 C) (Oral)   Ht 5\' 3"  (1.6 m)   Wt 108 lb (49 kg)   LMP 10/23/2013   SpO2 100%   BMI 19.13 kg/m   Visual Acuity Right Eye Distance:   Left Eye Distance:   Bilateral Distance:    Right Eye Near:   Left Eye Near:    Bilateral Near:     Physical Exam Nursing notes and Vital Signs reviewed. Appearance:  Patient appears stated age, and in no acute distress Eyes:  Pupils are equal, round, and reactive to light and accomodation.  Extraocular movement is intact.  Conjunctivae are not inflamed  Ears:  Canals normal.  Tympanic membranes normal.  Nose:  Mildly congested turbinates.  Maxillary sinus tenderness present.   Pharynx:  Normal Neck:  Supple.  Enlarged posterior/lateral nodes are palpated bilaterally, tender to palpation on the left.   Lungs:  Clear to auscultation.  Breath sounds are equal.  Moving air well. Heart:  Regular rate and rhythm without murmurs, rubs, or gallops.  Abdomen:  Nontender without masses or hepatosplenomegaly.  Bowel sounds are present.  No CVA or flank tenderness.  Extremities:  No edema.  Skin:  No rash present.    UC Treatments / Results  Labs (all labs ordered are listed, but only abnormal results are displayed) Labs Reviewed -  Tympanometry:  Right ear tympanogram negative peak pressure; Left ear tympanogram negative peak pressure  EKG  EKG Interpretation None       Radiology No results found.  Procedures Procedures (including critical care time)  Medications Ordered in UC Medications - No data to display   Initial Impression / Assessment and  Plan / UC Course  I have reviewed the triage vital signs and the nursing notes.  Pertinent labs & imaging results that were available during my care of the patient were reviewed by me and considered in my medical decision making (  see chart for details).    ?maxillary and frontal sinusitis. Suspect new developing viral URI. Begin prednisone burst/taper.  Begin amoxicillin. Take plain guaifenesin (1200mg  extended release tabs such as Mucinex) twice daily, with plenty of water, for cough and congestion.  May add Pseudoephedrine (30mg , one or two every 4 to 6 hours) for sinus congestion.  Get adequate rest.   May use Afrin nasal spray (or generic oxymetazoline) each morning for about 5 days and then discontinue.  Also recommend using saline nasal spray several times daily and saline nasal irrigation (AYR is a common brand).  Use Flonase nasal spray each morning after using Afrin nasal spray and saline nasal irrigation. Try warm salt water gargles for sore throat.  Stop all antihistamines for now, and other non-prescription cough/cold preparations. May take Delsym Cough Suppressant at bedtime for nighttime cough. .  Followup with ENT if not improved 10 days.    Final Clinical Impressions(s) / UC Diagnoses   Final diagnoses:  Eustachian tube dysfunction, bilateral  Viral URI with cough    ED Discharge Orders        Ordered    amoxicillin (AMOXIL) 875 MG tablet  2 times daily     11/25/17 1200    predniSONE (DELTASONE) 20 MG tablet     11/25/17 1200         Kandra Nicolas, MD 11/26/17 1511

## 2017-11-25 NOTE — ED Triage Notes (Signed)
Started around the 17th, became worse over the next week.  Green mucous, headache with pain under the eyes and into upper teeth.

## 2017-11-25 NOTE — Discharge Instructions (Signed)
Take plain guaifenesin (1200mg extended release tabs such as Mucinex) twice daily, with plenty of water, for cough and congestion.  May add Pseudoephedrine (30mg, one or two every 4 to 6 hours) for sinus congestion.  Get adequate rest.   May use Afrin nasal spray (or generic oxymetazoline) each morning for about 5 days and then discontinue.  Also recommend using saline nasal spray several times daily and saline nasal irrigation (AYR is a common brand).  Use Flonase nasal spray each morning after using Afrin nasal spray and saline nasal irrigation. Try warm salt water gargles for sore throat.  Stop all antihistamines for now, and other non-prescription cough/cold preparations. May take Delsym Cough Suppressant at bedtime for nighttime cough.  

## 2017-11-26 ENCOUNTER — Telehealth: Payer: Self-pay | Admitting: Emergency Medicine

## 2017-11-26 NOTE — Telephone Encounter (Signed)
Inquired about patient's status; encourage them to call with questions/concerns.  

## 2017-12-13 ENCOUNTER — Encounter: Payer: Self-pay | Admitting: Obstetrics & Gynecology

## 2018-01-06 MED FILL — PREMPRO 0.45-1.5 MG TABLET: 0.45-1.5 | 84 days supply | Qty: 84 | Fill #2

## 2018-01-13 ENCOUNTER — Other Ambulatory Visit: Payer: Self-pay | Admitting: Obstetrics & Gynecology

## 2018-01-13 DIAGNOSIS — Z1231 Encounter for screening mammogram for malignant neoplasm of breast: Secondary | ICD-10-CM

## 2018-02-06 ENCOUNTER — Ambulatory Visit: Payer: 59 | Admitting: Family Medicine

## 2018-02-06 ENCOUNTER — Encounter: Payer: Self-pay | Admitting: Family Medicine

## 2018-02-06 ENCOUNTER — Other Ambulatory Visit: Payer: Self-pay

## 2018-02-06 DIAGNOSIS — Z0000 Encounter for general adult medical examination without abnormal findings: Secondary | ICD-10-CM

## 2018-02-06 LAB — BASIC METABOLIC PANEL
BUN: 24 mg/dL — ABNORMAL HIGH (ref 6–23)
CHLORIDE: 102 meq/L (ref 96–112)
CO2: 32 mEq/L (ref 19–32)
Calcium: 9.8 mg/dL (ref 8.4–10.5)
Creatinine, Ser: 0.86 mg/dL (ref 0.40–1.20)
GFR: 71.73 mL/min (ref >60.00)
GLUCOSE: 88 mg/dL (ref 70–99)
POTASSIUM: 4.7 meq/L (ref 3.5–5.1)
SODIUM: 137 meq/L (ref 135–145)

## 2018-02-06 LAB — HEPATIC FUNCTION PANEL
ALK PHOS: 28 U/L — AB (ref 39–117)
ALT: 18 U/L (ref 0–35)
AST: 18 U/L (ref 0–37)
Albumin: 4.1 g/dL (ref 3.5–5.2)
BILIRUBIN DIRECT: 0 mg/dL (ref 0.0–0.3)
TOTAL PROTEIN: 7.1 g/dL (ref 6.0–8.3)
Total Bilirubin: 0.3 mg/dL (ref 0.2–1.2)

## 2018-02-06 LAB — LIPID PANEL
Cholesterol: 183 mg/dL (ref 0–200)
HDL: 76.8 mg/dL (ref >39.00)
LDL CALC: 86 mg/dL (ref 0–99)
NonHDL: 106.32
Total CHOL/HDL Ratio: 2
Triglycerides: 104 mg/dL (ref 0.0–149.0)
VLDL: 20.8 mg/dL (ref 0.0–40.0)

## 2018-02-06 LAB — CBC WITH DIFFERENTIAL/PLATELET
Basophils Absolute: 0 10*3/uL (ref 0.0–0.1)
Basophils Relative: 0.8 % (ref 0.0–3.0)
EOS PCT: 2 % (ref 0.0–5.0)
Eosinophils Absolute: 0.1 10*3/uL (ref 0.0–0.7)
HCT: 39.3 % (ref 36.0–46.0)
Hemoglobin: 13.2 g/dL (ref 12.0–15.0)
LYMPHS ABS: 1.4 10*3/uL (ref 0.7–4.0)
Lymphocytes Relative: 24.7 % (ref 12.0–46.0)
MCHC: 33.7 g/dL (ref 30.0–36.0)
MCV: 89.7 fl (ref 78.0–100.0)
MONOS PCT: 7.3 % (ref 3.0–12.0)
Monocytes Absolute: 0.4 10*3/uL (ref 0.1–1.0)
NEUTROS ABS: 3.8 10*3/uL (ref 1.4–7.7)
NEUTROS PCT: 65.2 % (ref 43.0–77.0)
PLATELETS: 281 10*3/uL (ref 150.0–400.0)
RBC: 4.38 Mil/uL (ref 3.87–5.11)
RDW: 13.7 % (ref 11.5–15.5)
WBC: 5.8 10*3/uL (ref 4.0–10.5)

## 2018-02-06 LAB — TSH: TSH: 3.66 u[IU]/mL (ref 0.35–4.50)

## 2018-02-06 NOTE — Progress Notes (Signed)
   Subjective:    Patient ID: Laura Munoz, female    DOB: 09-Jan-1956, 62 y.o.   MRN: [id number]  HPI New to establish.  No recent PCP.  GYN- Edwinna Areola.  GI- Fuller Plan  UTD on Tdap, flu shot (October 2018)  CPE- no concerns today.   Review of Systems Patient reports no vision/ hearing changes, adenopathy,fever, weight change,  persistant/recurrent hoarseness , swallowing issues, chest pain, palpitations, edema, persistant/recurrent cough, hemoptysis, dyspnea (rest/exertional/paroxysmal nocturnal), gastrointestinal bleeding (melena, rectal bleeding), abdominal pain, significant heartburn, bowel changes, GU symptoms (dysuria, hematuria, incontinence), Gyn symptoms (abnormal  bleeding, pain),  syncope, focal weakness, memory loss, numbness & tingling, skin/hair/nail changes, abnormal bruising or bleeding, anxiety, or depression.     Objective:   Physical Exam General Appearance:    Alert, cooperative, no distress, appears stated age  Head:    Normocephalic, without obvious abnormality, atraumatic  Eyes:    PERRL, conjunctiva/corneas clear, EOM's intact, fundi    benign, both eyes  Ears:    Normal TM's and external ear canals, both ears  Nose:   Nares normal, septum midline, mucosa normal, no drainage    or sinus tenderness  Throat:   Lips, mucosa, and tongue normal; teeth and gums normal  Neck:   Supple, symmetrical, trachea midline, no adenopathy;    Thyroid: no enlargement/tenderness/nodules  Back:     Symmetric, no curvature, ROM normal, no CVA tenderness  Lungs:     Clear to auscultation bilaterally, respirations unlabored  Chest Wall:    No tenderness or deformity   Heart:    Regular rate and rhythm, S1 and S2 normal, no murmur, rub   or gallop  Breast Exam:    Deferred to GYN  Abdomen:     Soft, non-tender, bowel sounds active all four quadrants,    no masses, no organomegaly  Genitalia:    Deferred to GYN  Rectal:    Extremities:   Extremities normal, atraumatic, no  cyanosis or edema  Pulses:   2+ and symmetric all extremities  Skin:   Skin color, texture, turgor normal, no rashes or lesions  Lymph nodes:   Cervical, supraclavicular, and axillary nodes normal  Neurologic:   CNII-XII intact, normal strength, sensation and reflexes    throughout          Assessment & Plan:

## 2018-02-06 NOTE — Patient Instructions (Signed)
Follow up in 1 year or as needed We'll notify you of your lab results and make any changes if needed Keep up the good work on healthy diet and regular exercise- you look great! Call with any questions or concerns Welcome!  We're glad to have you!!! 

## 2018-02-06 NOTE — Assessment & Plan Note (Signed)
Pt's PE WNL.  UTD on GYN, colonoscopy, immunizations.  Check labs.  Anticipatory guidance provided.  

## 2018-02-07 ENCOUNTER — Encounter: Payer: Self-pay | Admitting: General Practice

## 2018-02-11 DIAGNOSIS — L814 Other melanin hyperpigmentation: Secondary | ICD-10-CM | POA: Diagnosis not present

## 2018-02-11 DIAGNOSIS — D1801 Hemangioma of skin and subcutaneous tissue: Secondary | ICD-10-CM | POA: Diagnosis not present

## 2018-02-11 DIAGNOSIS — L080 Pyoderma: Secondary | ICD-10-CM | POA: Diagnosis not present

## 2018-02-11 DIAGNOSIS — D225 Melanocytic nevi of trunk: Secondary | ICD-10-CM | POA: Diagnosis not present

## 2018-02-11 MED FILL — DOXYCYCLINE HYCLATE 100 MG: 100 | 10 days supply | Qty: 20 | Fill #0

## 2018-02-27 MED FILL — DOXYCYCLINE HYCLATE 100 MG: 100 | 10 days supply | Qty: 20 | Fill #0

## 2018-03-31 MED FILL — PREMPRO 0.45-1.5 MG TABLET: 0.45-1.5 | 84 days supply | Qty: 84 | Fill #3

## 2018-04-07 ENCOUNTER — Ambulatory Visit
Admission: RE | Admit: 2018-04-07 | Discharge: 2018-04-07 | Disposition: A | Discharge status: Home / Self Care | Payer: 59 | Source: Ambulatory Visit | Attending: Obstetrics & Gynecology | Admitting: Obstetrics & Gynecology

## 2018-04-07 DIAGNOSIS — Z1231 Encounter for screening mammogram for malignant neoplasm of breast: Secondary | ICD-10-CM

## 2018-06-24 ENCOUNTER — Other Ambulatory Visit: Payer: Self-pay

## 2018-06-24 ENCOUNTER — Ambulatory Visit: Payer: BLUE CROSS/BLUE SHIELD | Admitting: Obstetrics & Gynecology

## 2018-06-24 ENCOUNTER — Encounter: Payer: Self-pay | Admitting: Obstetrics & Gynecology

## 2018-06-24 VITALS — BP 118/76 | HR 76 | Resp 16 | Ht 62.75 in | Wt 109.6 lb

## 2018-06-24 DIAGNOSIS — Z87898 Personal history of other specified conditions: Secondary | ICD-10-CM | POA: Diagnosis not present

## 2018-06-24 DIAGNOSIS — Z01419 Encounter for gynecological examination (general) (routine) without abnormal findings: Secondary | ICD-10-CM | POA: Diagnosis not present

## 2018-06-24 DIAGNOSIS — E118 Type 2 diabetes mellitus with unspecified complications: Secondary | ICD-10-CM | POA: Diagnosis not present

## 2018-06-24 MED ORDER — CONJ ESTROG-MEDROXYPROGEST ACE 0.45-1.5 MG PO TABS: 1.0000 | ORAL_TABLET | Freq: Every day | ORAL | 4 refills | Status: DC

## 2018-06-24 MED ORDER — SERTRALINE HCL 100 MG PO TABS: 100.0000 mg | ORAL_TABLET | Freq: Every day | ORAL | 4 refills | Status: DC

## 2018-06-24 MED FILL — SERTRALINE HCL 100 MG TAB: 100 | 30 days supply | Qty: 30 | Fill #0 | Status: TO

## 2018-06-24 MED FILL — PREMPRO 0.45-1.5 MG TABLET: 0.45-1.5 | 28 days supply | Qty: 28 | Fill #0 | Status: TO

## 2018-06-24 MED FILL — SHINGRIX 50 MCG SUS: 50 | 1 days supply | Qty: 1 | Fill #0

## 2018-06-24 NOTE — Progress Notes (Addendum)
62 y.o. G0P0000 MarriedCaucasianF here for annual exam.  Doing well.  Husband working at [employer].  They are still living here.  He is commuting every day.  Mother living with them.  She is 89.    Denies vaginal bleeding.    PCP:  Dr. Birdie Riddle.  Did blood work in March.  Patient's last menstrual period was 10/23/2013.          Sexually active: No.  The current method of family planning is post menopausal status.    Exercising: Yes.    6 x week  Smoker:  no  Health Maintenance: Pap:  04/05/17 Neg. HR HPV:neg   01/09/16 Neg. HR HPV:neg  History of abnormal Pap:  no MMG:  04/07/18 BIRADS1:Neg  Colonoscopy:  11/03/09 polyps. F/u 10 years  BMD:   05/02/16 Osteopenia, -1.2 TDaP: 10/20/2009 Pneumonia vaccine(s):  No Shingrix:   No Hep C testing: 04/05/17 Neg  Screening Labs: Hgb A1C   reports that she has never smoked. She has never used smokeless tobacco. She reports that she drinks about 1.2 oz of alcohol per week. She reports that she does not use drugs.  Past Medical History:  Diagnosis Date  . At risk for decreased bone density    DEXA 03/2011 normal (-1.0 at R fem)  . Breast nodule 04/1996   left  . Depression    reactive  . Hx of colonic polyps   . Iliotibial band syndrome of right side 09/2011  . Myalgia    back and hips   . Talipes cavus 02/2010    Past Surgical History:  Procedure Laterality Date  . DILATATION & CURRETTAGE/HYSTEROSCOPY WITH RESECTOCOPE N/A 07/25/2015   Procedure: DILATATION & CURETTAGE/HYSTEROSCOPY;  Surgeon: Megan Salon, MD;  Location: [facility];  Service: Gynecology;  Laterality: N/A;  . NO PAST SURGERIES      Current Outpatient Medications  Medication Sig Dispense Refill  . Calcium Carbonate-Vit D-Min (CALCIUM 1200 PO) Take by mouth daily.      Marland Kitchen estrogen, conjugated,-medroxyprogesterone (PREMPRO) 0.45-1.5 MG tablet Take 1 tablet by mouth daily. 90 tablet 4  . Multiple Vitamin (MULTIVITAMIN) tablet Take 1 tablet by mouth daily.      Marland Kitchen pyridOXINE (VITAMIN  B-6) 100 MG tablet Take 100 mg by mouth daily.    . sertraline (ZOLOFT) 100 MG tablet Take 1 tablet (100 mg total) by mouth daily. (Patient taking differently: Take 50 mg by mouth every other day. ) 90 tablet 4  . vitamin B-12 (CYANOCOBALAMIN) 1000 MCG tablet Take 1,000 mcg by mouth daily.     No current facility-administered medications for this visit.     Family History  Problem Relation Age of Onset  . Prostate cancer Father   . Dementia Father   . Osteopenia Mother   . Parkinson's disease Mother   . Breast cancer Paternal Grandmother   . Asthma Sister   . Early death Brother   . Coronary artery disease Maternal Grandmother   . Cancer Maternal Grandfather   . Asthma Sister     Review of Systems  Musculoskeletal: Positive for myalgias.  All other systems reviewed and are negative.   Exam:   BP 118/76 (BP Location: Right Arm, Patient Position: Sitting, Cuff Size: Normal)   Pulse 76   Resp 16   Ht 5' 2.75" (1.594 m)   Wt 109 lb 9.6 oz (49.7 kg)   LMP 10/23/2013   BMI 19.57 kg/m     Height: 5' 2.75" (159.4 cm)  Ht Readings  from Last 3 Encounters:  06/24/18 5' 2.75" (1.594 m)  02/06/18 5\' 3"  (1.6 m)  11/25/17 5\' 3"  (1.6 m)    General appearance: alert, cooperative and appears stated age Head: Normocephalic, without obvious abnormality, atraumatic Neck: no adenopathy, supple, symmetrical, trachea midline and thyroid normal to inspection and palpation Lungs: clear to auscultation bilaterally Breasts: normal appearance, no masses or tenderness Heart: regular rate and rhythm Abdomen: soft, non-tender; bowel sounds normal; no masses,  no organomegaly Extremities: extremities normal, atraumatic, no cyanosis or edema Skin: Skin color, texture, turgor normal. No rashes or lesions Lymph nodes: Cervical, supraclavicular, and axillary nodes normal. No abnormal inguinal nodes palpated Neurologic: Grossly normal   Pelvic: External genitalia:  no lesions              Urethra:   normal appearing urethra with no masses, tenderness or lesions              Bartholins and Skenes: normal                 Vagina: normal appearing vagina with normal color and discharge, no lesions              Cervix: no lesions              Pap taken: No. Bimanual Exam:  Uterus:  normal size, contour, position, consistency, mobility, non-tender              Adnexa: normal adnexa and no mass, fullness, tenderness               Rectovaginal: Confirms               Anus:  normal sphincter tone, no lesions  Chaperone was present for exam.  A:  Well Woman with normal exam PMP, on HRT H/o prediabetes  P:   Mammogram guidelines reviewed pap smear with neg HR HPV 2018.  No pap today. On HRT 0.45/1.5 Prempro daily.  #90/4RF RF for Zoloft 100mg  1/2 tab every other day.  #90/4RF. HbA1C obtained today return annually or prn

## 2018-06-25 LAB — HEMOGLOBIN A1C
Est. average glucose Bld gHb Est-mCnc: 108 mg/dL
Hgb A1c MFr Bld: 5.4 % (ref 4.8–5.6)

## 2018-07-20 ENCOUNTER — Encounter: Payer: Self-pay | Admitting: Emergency Medicine

## 2018-07-20 ENCOUNTER — Emergency Department
Admission: EM | Admit: 2018-07-20 | Discharge: 2018-07-20 | Disposition: A | Discharge status: Home / Self Care | Payer: BLUE CROSS/BLUE SHIELD | Source: Home / Self Care | Attending: Family Medicine | Admitting: Family Medicine

## 2018-07-20 DIAGNOSIS — S61215A Laceration without foreign body of left ring finger without damage to nail, initial encounter: Secondary | ICD-10-CM

## 2018-07-20 DIAGNOSIS — Z23 Encounter for immunization: Secondary | ICD-10-CM | POA: Diagnosis not present

## 2018-07-20 MED ORDER — TETANUS-DIPHTH-ACELL PERTUSSIS 5-2.5-18.5 LF-MCG/0.5 IM SUSP
0.5000 mL | Freq: Once | INTRAMUSCULAR | Status: AC
  Administered 2018-07-20: 0.5 mL via INTRAMUSCULAR

## 2018-07-20 NOTE — Discharge Instructions (Addendum)
°  Please try to keep the bandage in place for at least 24 hours.  You may then remove the bandage and gentle clean your finger with warm water and mild soap.   You may shower and wash your hands like normal but avoid soaking your finger in water to help the steri-strip stay in place for as long as possible (about 1 week).  You may apply a regular bandage while the wound continues to heal over the next 1 week.

## 2018-07-20 NOTE — ED Provider Notes (Signed)
[name]    CSN: [id number] Arrival date & time: 07/20/18  1547     History   Chief Complaint Chief Complaint  Patient presents with  . Laceration    HPI Laura Munoz is a 62 y.o. female.   HPI Laura Munoz is a 62 y.o. female presenting to UC with c/o laceration to her Left ring finger today, cutting it on a knife while cutting onions.  Bleeding controlled PTA but pt states it did not want to stop bleeding when she was at home. She did run it under the skin while at home.  pain is moderate In severity. Last Td 09/2009.     Past Medical History:  Diagnosis Date  . At risk for decreased bone density    DEXA 03/2011 normal (-1.0 at R fem)  . Breast nodule 04/1996   left  . Depression    reactive  . Hx of colonic polyps   . Iliotibial band syndrome of right side 09/2011  . Myalgia    back and hips   . Talipes cavus 02/2010    Patient Active Problem List   Diagnosis Date Noted  . Physical exam 02/06/2018  . 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 Postmenopausal HRT (hormone replacement therapy) 01/28/2015  . Low back pain radiating to left lower extremity 08/03/2014  . Tenosynovitis, de Quervain 01/21/2013  . Calf pain 12/02/2012  . Situational mixed anxiety and depressive disorder 04/10/2012  . Iliotibial band syndrome of right side 10/09/2011  . TALIPES CAVUS 03/23/2010    Past Surgical History:  Procedure Laterality Date  . DILATATION & CURRETTAGE/HYSTEROSCOPY WITH RESECTOCOPE N/A 07/25/2015   Procedure: DILATATION & CURETTAGE/HYSTEROSCOPY;  Surgeon: Megan Salon, MD;  Location: [facility];  Service: Gynecology;  Laterality: N/A;  . NO PAST SURGERIES      OB History    Gravida  0   Para  0   Term  0   Preterm  0   AB  0   Living  0     SAB  0   TAB  0   Ectopic  0   Multiple  0   Live Births               Home Medications    Prior to Admission medications   Medication Sig Start Date End Date Taking?  Authorizing Provider  Calcium Carbonate-Vit D-Min (CALCIUM 1200 PO) Take by mouth daily.      [provider]  estrogen, conjugated,-medroxyprogesterone (PREMPRO) 0.45-1.5 MG tablet Take 1 tablet by mouth daily. 06/24/18   Megan Salon, MD  Multiple Vitamin (MULTIVITAMIN) tablet Take 1 tablet by mouth daily.      [provider]  pyridOXINE (VITAMIN B-6) 100 MG tablet Take 100 mg by mouth daily.    [provider]  sertraline (ZOLOFT) 100 MG tablet Take 1 tablet (100 mg total) by mouth daily. 06/24/18   Megan Salon, MD  vitamin B-12 (CYANOCOBALAMIN) 1000 MCG tablet Take 1,000 mcg by mouth daily.    [provider]    Family History Family History  Problem Relation Age of Onset  . Prostate cancer Father   . Dementia Father   . Osteopenia Mother   . Parkinson's disease Mother   . Breast cancer Paternal Grandmother   . Asthma Sister   . Early death Brother   . Coronary artery disease Maternal Grandmother   . Cancer Maternal Grandfather   . Asthma Sister  Social History Social History   Tobacco Use  . Smoking status: Never Smoker  . Smokeless tobacco: Never Used  . 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 Alcohol use: Yes    Alcohol/week: 2.0 standard drinks    Types: 2 Standard drinks or equivalent per week    Comment: socailly  . Drug use: No     Allergies   Chocolate and Codeine   Review of Systems Review of Systems  Musculoskeletal: Negative for arthralgias and joint swelling.  Skin: Positive for wound. Negative for color change.     Physical Exam Triage Vital Signs ED Triage Vitals  Enc Vitals Group     BP 07/20/18 1613 137/78     Pulse Rate 07/20/18 1613 72     Resp --      Temp 07/20/18 1613 98.4 F (36.9 C)     Temp Source 07/20/18 1613 Oral     SpO2 07/20/18 1613 99 %     Weight 07/20/18 1614 109 lb 8 oz (49.7 kg)     Height 07/20/18 1614 5\' 3"  (1.6 m)      Head Circumference --      Peak Flow --      Pain Score 07/20/18 1614 3     Pain Loc --      Pain Edu? --      Excl. in [place]? --    No data found.  Updated Vital Signs BP 137/78 (BP Location: Right Arm)   Pulse 72   Temp 98.4 F (36.9 C) (Oral)   Ht 5\' 3"  (1.6 m)   Wt 109 lb 8 oz (49.7 kg)   LMP 10/23/2013   SpO2 99%   BMI 19.40 kg/m   Visual Acuity Right Eye Distance:   Left Eye Distance:   Bilateral Distance:    Right Eye Near:   Left Eye Near:    Bilateral Near:     Physical Exam  Constitutional: She is oriented to person, place, and time. She appears well-developed and well-nourished.  HENT:  Head: Normocephalic and atraumatic.  Eyes: EOM are normal.  Neck: Normal range of motion.  Cardiovascular: Normal rate.  Pulmonary/Chest: Effort normal.  Musculoskeletal: Normal range of motion.  Left ring finger: full ROM, no edema.  Neurological: She is alert and oriented to person, place, and time.  Skin: Skin is warm and dry.  Left ring finger, distal volar aspect: 1cm 'U' shaped superficial skin avulsion with scant red bleeding. Skin flap approximated well. No foreign bodies noted.   Psychiatric: She has a normal mood and affect. Her behavior is normal.  Nursing note and vitals reviewed.    UC Treatments / Results  Labs (all labs ordered are listed, but only abnormal results are displayed) Labs Reviewed - No data to display  EKG None  Radiology No results found.  Procedures Laceration Repair Date/Time: 07/21/2018 1:48 PM Performed by: Noe Gens, PA-C Authorized by: Kandra Nicolas, MD   Consent:    Consent obtained:  Verbal   Consent given by:  Patient   Risks discussed:  Infection and pain   Alternatives discussed:  Delayed treatment and no treatment Anesthesia (see MAR for exact dosages):    Anesthesia method:  None Laceration details:    Location:  Finger   Finger location:  L ring finger   Length (cm):  1   Depth (mm):  2 Repair  type:    Repair type:  Simple Pre-procedure details:    Preparation:  Patient was prepped and draped in usual sterile fashion Exploration:    Hemostasis achieved with:  Direct pressure   Wound exploration: wound explored through full range of motion and entire depth of wound probed and visualized     Wound extent: no areolar tissue violation noted, no fascia violation noted, no foreign bodies/material noted, no nerve damage noted and no tendon damage noted     Contaminated: no   Treatment:    Area cleansed with:  Saline and Hibiclens   Amount of cleaning:  Standard Skin repair:    Repair method:  Steri-Strips   Number of Steri-Strips:  1 Approximation:    Approximation:  Close Post-procedure details:    Dressing:  Bulky dressing   Patient tolerance of procedure:  Tolerated well, no immediate complications   (including critical care time)  Medications Ordered in UC Medications  Tdap (BOOSTRIX) injection 0.5 mL (0.5 mLs Intramuscular Given 07/20/18 1633)    Initial Impression / Assessment and Plan / UC Course  I have reviewed the triage vital signs and the nursing notes.  Pertinent labs & imaging results that were available during my care of the patient were reviewed by me and considered in my medical decision making (see chart for details).     Laceration to Left ring finger, treated as noted above. Home instructions provided.  Final Clinical Impressions(s) / UC Diagnoses   Final diagnoses:  Laceration of left ring finger without foreign body without damage to nail, initial encounter     Discharge Instructions      Please try to keep the bandage in place for at least 24 hours.  You may then remove the bandage and gentle clean your finger with warm water and mild soap.   You may shower and wash your hands like normal but avoid soaking your finger in water to help the steri-strip stay in place for as long as possible (about 1 week).  You may apply a regular bandage  while the wound continues to heal over the next 1 week.    ED Prescriptions    None     Controlled Substance Prescriptions [place] Controlled Substance Registry consulted? Not Applicable   Tyrell Antonio 07/21/18 1350

## 2018-07-20 NOTE — ED Triage Notes (Signed)
Patient cut her left 3rd finger today while cutting onions, last Td 09/2009.

## 2018-07-31 ENCOUNTER — Ambulatory Visit: Payer: 59 | Admitting: Family Medicine

## 2018-09-15 ENCOUNTER — Ambulatory Visit: Payer: Self-pay

## 2018-09-15 ENCOUNTER — Ambulatory Visit: Payer: BLUE CROSS/BLUE SHIELD | Admitting: Sports Medicine

## 2018-09-15 ENCOUNTER — Encounter: Payer: Self-pay | Admitting: Sports Medicine

## 2018-09-15 VITALS — BP 118/74 | Ht 63.0 in | Wt 109.0 lb

## 2018-09-15 DIAGNOSIS — M722 Plantar fascial fibromatosis: Secondary | ICD-10-CM | POA: Insufficient documentation

## 2018-09-15 DIAGNOSIS — M79672 Pain in left foot: Secondary | ICD-10-CM

## 2018-09-15 DIAGNOSIS — M7711 Lateral epicondylitis, right elbow: Secondary | ICD-10-CM | POA: Insufficient documentation

## 2018-09-15 MED FILL — SHINGRIX 50 MCG SUS: 50 | 1 days supply | Qty: 1 | Fill #1

## 2018-09-15 NOTE — Assessment & Plan Note (Signed)
HEP Ice massage Consider top NTG if not improving rapidly

## 2018-09-15 NOTE — Assessment & Plan Note (Signed)
Stretches PF HEP series Arch strap Use sports insole and today we added scaphoid pads and forefoot poron padding  Reck 6 weeks

## 2018-09-15 NOTE — Progress Notes (Signed)
CC: left heel pain  Pain in medial left heel for 3 mos. Tries stretches and exercises w minimal change Hurts first step out of bed Uses sports insoles for exercise Past hx of medial gastroc tear on left  Also notes 2 mos of RT elbow pain Hurts to carry milk, lift or open door  ROS No sciatic pain No swelling in foot Denies neck pain No radiation of pain to elbow from neck  Gen: pleasant, trim lady in NAD BP 118/74   Ht 5\' 3"  (1.6 m)   Wt 109 lb (49.4 kg)   LMP 10/23/2013   BMI 19.31 kg/m   LT foot TTP over med. insertion of PF to medial heel High arch No swelling  Full ROM of RT elbow Pain w resisted extension Pain directly over lat. Epicondyle to palpation No swelling  Ultrasound of Left Plantar fascia  There is hypoechoic change at proximal plantar fascia No tear Measures 0.58 cm vs. Normal of 0.35 Mid foot plantar fascia measures 0.3 cm but should be 0.2  RT Elbow There is small avulsion fragment at tip Extensor tendon intact Some mild hypoechoic change in proximal 2 cms + neovessels and increased doppler flow  Impression:  Left plantar fasciitis and RT Lateral epicondylitis  Ultrasound and interpretation by Wolfgang Phoenix. Oneida Alar, MD

## 2018-10-08 ENCOUNTER — Telehealth: Payer: Self-pay

## 2018-10-08 MED ORDER — NITROGLYCERIN 0.2 MG/HR TD PT24: MEDICATED_PATCH | TRANSDERMAL | 1 refills | Status: DC

## 2018-10-08 NOTE — Telephone Encounter (Signed)
Prescription sent into pharmacy

## 2018-10-30 ENCOUNTER — Ambulatory Visit: Payer: BLUE CROSS/BLUE SHIELD | Admitting: Sports Medicine

## 2018-10-30 DIAGNOSIS — M7711 Lateral epicondylitis, right elbow: Secondary | ICD-10-CM | POA: Diagnosis not present

## 2018-10-30 DIAGNOSIS — M722 Plantar fascial fibromatosis: Secondary | ICD-10-CM | POA: Diagnosis not present

## 2018-10-30 NOTE — Progress Notes (Signed)
  Laura Munoz - 62 y.o. female MRN [id number]  Date of birth: 1956-03-21    SUBJECTIVE:      Chief Complaint:/ HPI:  Laura Munoz is a 61 year old female here for follow-up of R elbow pain.  She reports the pain is slightly worse now.  In addition, she is concerned about a "bump" on her R elbow that she noticed 2-3 weeks ago that was initially red and swollen but has decreased somewhat in size. Denies recent trauma.  The pain is characterized as sharp and located at the lateral epicondyle.  It is aggravated by movements such as placing a water bottle in the cup holder in her car or reaching down to grab things under the cabinet or light touch.  It is alleviated by using her Helix wrap and pain medication.  She denies radiation of pain.  She has been treating it with Aleve and Nitro patches which have provided some relief.  She endorses some numbness and weakness as well.  Laura Munoz has been performing her home physical therapy exercises as directed.    Regarding L foot pain, she reports it is "95% improved" and has no symptoms today.  ROS:     No radicular sxs or neck pain No weakness in hands  PERTINENT  PMH / PSH FH / / SH:  Past Medical, Surgical, Social, and Family History Reviewed & Updated in the EMR.  Pertinent findings include:    OBJECTIVE: BP 104/74   Ht 5\' 3"  (1.6 m)   Wt 109 lb (49.4 kg)   LMP 10/23/2013   BMI 19.31 kg/m   Physical Exam:  Vital signs are reviewed.  GEN: Alert and oriented, NAD Pulm: Breathing unlabored PSY: normal mood, congruent affect  MSK: R elbow: Inspection:  No erythema or swelling.  No rashes. Palpation:  TTP to lateral epicondyle Range of Motion: Slight hyperextension to bilateral elbows.  Strength: Pain is moderate with book test No real pain on finger extension or wrist extension against resistance Neurovascular Status: Sensation intact  Foot shows only mild TTP at medial heel  Ultrasound Right lateral epicondyle There is some  hypoechoic change in extensor tendon Small avulsion fragment at tip of lateral epicondyle There is an area of increased doppler flow and neovessels This is somewhat improved from original [place]  Ultrasound Left plantar fascia There is less hypoechoic change surrounding insertion of plantar fascia compared to last [place].     ASSESSMENT & PLAN:  1. Laura Munoz is a 62 year old female here for follow-up of R lateral epicondylitis that is only mildly improved.  Ultrasound today does not demonstrate elbow effusion, fracture or tear.  Bedside ultrasound today demonstrates increased doppler activity at the lateral epiondyle and extensor tendons compared to last ultrasound with the presence of significant neo-vessels.  Plantar fascitis scan somewhat improved.Will add intrinsic hand exercises to at home exercises and encourage her to increase weight used from 2lbs to 3lbs.  NTG seems to be increasing blood flow and will continue this.  Follow-up in 6 weeks.    I observed and examined the patient with the student and repeated key components and agree with assessment and plan.  Note reviewed and modified by me. Laura Mcgill, MD

## 2018-10-31 ENCOUNTER — Encounter: Payer: Self-pay | Admitting: Sports Medicine

## 2018-10-31 NOTE — Assessment & Plan Note (Signed)
[place] is improved Symptoms 95% improved  Keep up stretches and use arch support as provided

## 2018-10-31 NOTE — Assessment & Plan Note (Signed)
Slight improvement but still painful  Keep up Ntg Keep up HEP Use compression when active

## 2018-12-11 ENCOUNTER — Ambulatory Visit: Payer: BLUE CROSS/BLUE SHIELD | Admitting: Sports Medicine

## 2018-12-11 ENCOUNTER — Encounter: Payer: Self-pay | Admitting: Sports Medicine

## 2018-12-11 VITALS — BP 98/70 | Ht 63.0 in | Wt 109.0 lb

## 2018-12-11 DIAGNOSIS — M25521 Pain in right elbow: Secondary | ICD-10-CM

## 2018-12-11 DIAGNOSIS — M7711 Lateral epicondylitis, right elbow: Secondary | ICD-10-CM | POA: Diagnosis not present

## 2018-12-11 NOTE — Assessment & Plan Note (Signed)
With slow healing process I wonder if this is less of a typical tennis elbow and perhaps there is a small occult fracture  Will get standard XRays to look for fracture For now will cont standard RX for tennis elbow and modify pending XR

## 2018-12-11 NOTE — Progress Notes (Signed)
CC: RT elbow pain  This patient first hurt her elbow in August We first saw her in October and ultrasound at that time revealed a small avulsion fragment at the tip of the lateral epicondyle There was also increased Doppler flow This all suggested lateral epicondylitis  Her original mechanism of injury however was banging her elbow against the tile while she was in the shower  In December on follow-up treatment There was some minimal improvement in her ultrasound but still a moderate amount of tenderness Overall the findings were improved versus October  Now 6 weeks later in January she continues to have a lot of tenderness over the tip of the elbow She has been able to increase her home exercises to using up to 3 pound dumbbell Pain persists at a 2-3 level  Physical examination Pleasant female who looks younger than stated age and is thin and fit BP 98/70   Ht 5\' 3"  (1.6 m)   Wt 109 lb (49.4 kg)   LMP 10/23/2013   BMI 19.31 kg/m   Elbow shows full extension Full flexion Good supination and pronation Still some pain with resisted elbow extension Very tender to palpation just proximal to the radial head more posterior than the lateral epicondyle  Repeat ultrasound evaluation RT Elbow In looking at the lateral epicondyles the tendon looks intact There is less abnormal Doppler flow No joint effusion However the area of bone at the tip of the epicondyles shows a small fragment The area of bone at the distal humeral condyle shows some fragmentation and calcification  Impression: Question possible minor fracture of the distal condyle of humerus based on ultrasound appearance with less overall changes of lateral epicondylitis  Ultrasound and interpretation by Wolfgang Phoenix. Oneida Alar, MD

## 2018-12-12 ENCOUNTER — Ambulatory Visit
Admission: RE | Admit: 2018-12-12 | Discharge: 2018-12-12 | Disposition: A | Discharge status: Home / Self Care | Payer: BLUE CROSS/BLUE SHIELD | Source: Ambulatory Visit | Attending: Sports Medicine | Admitting: Sports Medicine

## 2018-12-12 DIAGNOSIS — M25521 Pain in right elbow: Secondary | ICD-10-CM | POA: Diagnosis not present

## 2018-12-12 DIAGNOSIS — S59901A Unspecified injury of right elbow, initial encounter: Secondary | ICD-10-CM | POA: Diagnosis not present

## 2019-01-08 DIAGNOSIS — H2513 Age-related nuclear cataract, bilateral: Secondary | ICD-10-CM | POA: Diagnosis not present

## 2019-01-08 DIAGNOSIS — H5213 Myopia, bilateral: Secondary | ICD-10-CM | POA: Diagnosis not present

## 2019-02-12 ENCOUNTER — Encounter: Payer: 59 | Admitting: Family Medicine

## 2019-03-03 ENCOUNTER — Ambulatory Visit: Payer: BLUE CROSS/BLUE SHIELD | Admitting: Sports Medicine

## 2019-04-27 DIAGNOSIS — L820 Inflamed seborrheic keratosis: Secondary | ICD-10-CM | POA: Diagnosis not present

## 2019-04-27 DIAGNOSIS — D1801 Hemangioma of skin and subcutaneous tissue: Secondary | ICD-10-CM | POA: Diagnosis not present

## 2019-04-27 DIAGNOSIS — L821 Other seborrheic keratosis: Secondary | ICD-10-CM | POA: Diagnosis not present

## 2019-04-27 DIAGNOSIS — L814 Other melanin hyperpigmentation: Secondary | ICD-10-CM | POA: Diagnosis not present

## 2019-04-27 DIAGNOSIS — D225 Melanocytic nevi of trunk: Secondary | ICD-10-CM | POA: Diagnosis not present

## 2019-05-07 ENCOUNTER — Encounter: Payer: Self-pay | Admitting: Family Medicine

## 2019-05-08 ENCOUNTER — Encounter: Payer: Self-pay | Admitting: Physician Assistant

## 2019-05-08 ENCOUNTER — Other Ambulatory Visit: Payer: Self-pay

## 2019-05-08 ENCOUNTER — Ambulatory Visit (INDEPENDENT_AMBULATORY_CARE_PROVIDER_SITE_OTHER): Payer: BLUE CROSS/BLUE SHIELD | Admitting: Physician Assistant

## 2019-05-08 DIAGNOSIS — S40262A Insect bite (nonvenomous) of left shoulder, initial encounter: Secondary | ICD-10-CM

## 2019-05-08 DIAGNOSIS — W57XXXA Bitten or stung by nonvenomous insect and other nonvenomous arthropods, initial encounter: Secondary | ICD-10-CM

## 2019-05-08 DIAGNOSIS — R21 Rash and other nonspecific skin eruption: Secondary | ICD-10-CM | POA: Diagnosis not present

## 2019-05-08 MED ORDER — TRIAMCINOLONE ACETONIDE 0.1 % EX CREA: 1.0000 "application " | TOPICAL_CREAM | Freq: Two times a day (BID) | CUTANEOUS | 0 refills | Status: DC

## 2019-05-08 MED ORDER — CEPHALEXIN 500 MG PO CAPS: 500.0000 mg | ORAL_CAPSULE | Freq: Two times a day (BID) | ORAL | 0 refills | Status: AC

## 2019-05-08 NOTE — Progress Notes (Signed)
Virtual Visit via Video   I connected with patient on 05/08/19 at 11:30 AM EDT by a video enabled telemedicine application and verified that I am speaking with the correct person using two identifiers.  Location patient: Home Location provider: [facility], Office Persons participating in the virtual visit: Patient, Provider, Dana (Patina Moore)  I discussed the limitations of evaluation and management by telemedicine and the availability of in person appointments. The patient expressed understanding and agreed to proceed.  Subjective:   HPI:   Patient presents today via D[employer]Me for assessment of acute concerns.  Patient endorses bug bite of left posterior shoulder obtained on Tuesday while at [facility]. Notes the area was itchy and raised with some redness. Notes some improvement overnight but symptoms still present. Notes her husband had a similar bite at the same time, was assessed by PCP yesterday and given a prescription for Keflex in case infection began. Denies fever, chills, malaise.   Patient also notes a rash of her anterior chest appearing suddenly after working out yesterday. Notes significant sweating. Rash is non-pruritic or non-painful.  Is still present today but is lighter in coloration. Denies rash elsewhere. Denies change to soaps, lotions  ROS:   See pertinent positives and negatives per HPI.  Patient Active Problem List   Diagnosis Date Noted  . Plantar fasciitis of left foot 09/15/2018  . Lateral epicondylitis of right elbow 09/15/2018  . Physical exam 02/06/2018  . 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 Postmenopausal HRT (hormone replacement therapy) 01/28/2015  . Low back pain radiating to left lower extremity 08/03/2014  . Tenosynovitis, de Quervain 01/21/2013  . Calf pain 12/02/2012  . Situational mixed anxiety and depressive disorder 04/10/2012  . Iliotibial band syndrome of right side 10/09/2011  . TALIPES CAVUS  03/23/2010    Social History   Tobacco Use  . Smoking status: Never Smoker  . Smokeless tobacco: Never Used  . 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 Alcohol use: Yes    Alcohol/week: 2.0 standard drinks    Types: 2 Standard drinks or equivalent per week    Comment: socailly    Current Outpatient Medications:  .  Calcium Carbonate-Vit D-Min (CALCIUM 1200 PO), Take by mouth daily.  , Disp: , Rfl:  .  estrogen, conjugated,-medroxyprogesterone (PREMPRO) 0.45-1.5 MG tablet, Take 1 tablet by mouth daily., Disp: 90 tablet, Rfl: 4 .  Multiple Vitamin (MULTIVITAMIN) tablet, Take 1 tablet by mouth daily.  , Disp: , Rfl:  .  nitroGLYCERIN (NITRODUR - DOSED IN MG/24 HR) 0.2 mg/hr patch, Use 1/4 patch daily to the affected area., Disp: 30 patch, Rfl: 1 .  pyridOXINE (VITAMIN B-6) 100 MG tablet, Take 100 mg by mouth daily., Disp: , Rfl:  .  sertraline (ZOLOFT) 100 MG tablet, Take 1 tablet (100 mg total) by mouth daily. (Patient taking differently: Take 100 mg by mouth daily. taking every other day), Disp: 90 tablet, Rfl: 4 .  vitamin B-12 (CYANOCOBALAMIN) 1000 MCG tablet, Take 1,000 mcg by mouth daily., Disp: , Rfl:   Allergies  Allergen Reactions  . Chocolate   . Codeine Other (See Comments)    Passes out    Objective:   LMP 10/23/2013   Patient is well-developed, well-nourished in no acute distress.  Resting comfortably in chair at home.  Head is normocephalic, atraumatic.  No labored breathing.  Speech is clear and coherent with logical contest.  Patient is alert and  oriented at baseline.  Lesion of concern on posterior L shoulder is estimated to be about 1 cm x 1.5 cm lesion of localized inflammation without sign of puncture, excoriation or drainage (see picture below). Rash of chest is generalized erythema, more focal just above the cleave of breasts. No singular lesions identified. No similar rash noted elsewhere.     Assessment and Plan:   1. Insect bite of left shoulder, initial encounter No sign of infection. Mild, local histamine response from bite. Continue benadryl spray. Cool compresses recommended. She is concerned about infection based on what her husband's provider told them. Will give a script for Keflex. Reviewed indications for starting but do not anticipate this will be needed.  - cephALEXin (KEFLEX) 500 MG capsule; Take 1 capsule (500 mg total) by mouth 2 (two) times daily for 7 days.  Dispense: 14 capsule; Refill: 0  2. Rash Likely heat/sweat rash. Discussed supportive measures for this. Follow-up if not resolving.  - triamcinolone cream (KENALOG) 0.1 %; Apply 1 application topically 2 (two) times daily.  Dispense: 30 g; Refill: 0    Leeanne Rio, PA-C 05/08/2019

## 2019-05-08 NOTE — Progress Notes (Signed)
I have discussed the procedure for the virtual visit with the patient who has given consent to proceed with assessment and treatment.   Laura Munoz [name], CMA     

## 2019-05-08 NOTE — Patient Instructions (Signed)
Instructions sent to MyChart.  For the insect bite -- this looks like a typical histamine response to a bug bite. No sign of infection today. Keep skin clean and dry. Can continue the benadryl spray to the area for itch. If you note any increased redness, tenderness or pain, fill the antibiotic (sent to pharmacy) and let us know.   For the rash -- keep skin clean and dry. Apply the Kenalog cream twice daily over the weekend. Keep cool! Avoid exercise this weekend.  Heat and hot water will worsen appearance of rash. Follow-up if not resolving.

## 2019-06-17 ENCOUNTER — Other Ambulatory Visit: Payer: Self-pay | Admitting: Obstetrics & Gynecology

## 2019-06-17 ENCOUNTER — Encounter: Payer: Self-pay | Admitting: Obstetrics & Gynecology

## 2019-06-17 MED ORDER — PREMPRO 0.45-1.5 MG PO TABS: 1.0000 | ORAL_TABLET | Freq: Every day | ORAL | 1 refills | Status: DC

## 2019-06-17 NOTE — Telephone Encounter (Signed)
Patient sent the following correspondence through [facility]. Routing to triage to assist patient with request.  My apologies for using My Chart messaging for a prescription refill.   I need Prempro (.45 - 1.5 mg) refilled.   I am now using:  [facility]   [address], [place], [place] [address]  [phone].       (We are no longer using the [facility]!)    Please let me know if you have any questions. Thank you! Stay cool. It is very warm out there!  Laura Munoz (DOB 07/25/1956)  [phone]  [place], [place] [address]  [phone]

## 2019-06-17 NOTE — Telephone Encounter (Signed)
Medication refill request: Prempro Last AEX:  06/24/18  Next AEX: 10/27/19 Last MMG (if hormonal medication request): 04/07/18 birads 1 neg  Refill authorized: #90 with 1 RF

## 2019-07-01 ENCOUNTER — Encounter: Payer: BLUE CROSS/BLUE SHIELD | Admitting: Family Medicine

## 2019-07-02 ENCOUNTER — Other Ambulatory Visit: Payer: Self-pay | Admitting: Obstetrics & Gynecology

## 2019-07-02 DIAGNOSIS — Z1231 Encounter for screening mammogram for malignant neoplasm of breast: Secondary | ICD-10-CM

## 2019-07-17 ENCOUNTER — Ambulatory Visit
Admission: RE | Admit: 2019-07-17 | Discharge: 2019-07-17 | Disposition: A | Discharge status: Home / Self Care | Payer: BLUE CROSS/BLUE SHIELD | Source: Ambulatory Visit

## 2019-07-17 ENCOUNTER — Other Ambulatory Visit: Payer: Self-pay

## 2019-07-17 DIAGNOSIS — Z1231 Encounter for screening mammogram for malignant neoplasm of breast: Secondary | ICD-10-CM

## 2019-07-27 ENCOUNTER — Ambulatory Visit (INDEPENDENT_AMBULATORY_CARE_PROVIDER_SITE_OTHER): Payer: BLUE CROSS/BLUE SHIELD | Admitting: Family Medicine

## 2019-07-27 ENCOUNTER — Other Ambulatory Visit: Payer: Self-pay

## 2019-07-27 ENCOUNTER — Encounter: Payer: Self-pay | Admitting: Family Medicine

## 2019-07-27 VITALS — BP 110/72 | HR 66 | Temp 97.8°F | Resp 16 | Ht 63.0 in | Wt 110.2 lb

## 2019-07-27 DIAGNOSIS — Z0000 Encounter for general adult medical examination without abnormal findings: Secondary | ICD-10-CM

## 2019-07-27 LAB — CBC WITH DIFFERENTIAL/PLATELET
Basophils Absolute: 0 10*3/uL (ref 0.0–0.1)
Basophils Relative: 1 % (ref 0.0–3.0)
Eosinophils Absolute: 0.1 10*3/uL (ref 0.0–0.7)
Eosinophils Relative: 2.4 % (ref 0.0–5.0)
HCT: 38.9 % (ref 36.0–46.0)
Hemoglobin: 12.9 g/dL (ref 12.0–15.0)
Lymphocytes Relative: 39.3 % (ref 12.0–46.0)
Lymphs Abs: 1.3 10*3/uL (ref 0.7–4.0)
MCHC: 33.1 g/dL (ref 30.0–36.0)
MCV: 90.9 fl (ref 78.0–100.0)
Monocytes Absolute: 0.3 10*3/uL (ref 0.1–1.0)
Monocytes Relative: 9.6 % (ref 3.0–12.0)
Neutro Abs: 1.6 10*3/uL (ref 1.4–7.7)
Neutrophils Relative %: 47.7 % (ref 43.0–77.0)
Platelets: 272 10*3/uL (ref 150.0–400.0)
RBC: 4.28 Mil/uL (ref 3.87–5.11)
RDW: 13.2 % (ref 11.5–15.5)
WBC: 3.4 10*3/uL — ABNORMAL LOW (ref 4.0–10.5)

## 2019-07-27 LAB — BASIC METABOLIC PANEL
BUN: 20 mg/dL (ref 6–23)
CO2: 31 mEq/L (ref 19–32)
Calcium: 9.5 mg/dL (ref 8.4–10.5)
Chloride: 101 mEq/L (ref 96–112)
Creatinine, Ser: 0.86 mg/dL (ref 0.40–1.20)
GFR: 67.16 mL/min (ref >60.00)
Glucose, Bld: 76 mg/dL (ref 70–99)
Potassium: 4.6 mEq/L (ref 3.5–5.1)
Sodium: 137 mEq/L (ref 135–145)

## 2019-07-27 LAB — LIPID PANEL
Cholesterol: 183 mg/dL (ref 0–200)
HDL: 71.5 mg/dL (ref >39.00)
LDL Cholesterol: 95 mg/dL (ref 0–99)
NonHDL: 111.64
Total CHOL/HDL Ratio: 3
Triglycerides: 84 mg/dL (ref 0.0–149.0)
VLDL: 16.8 mg/dL (ref 0.0–40.0)

## 2019-07-27 LAB — HEPATIC FUNCTION PANEL
ALT: 18 U/L (ref 0–35)
AST: 21 U/L (ref 0–37)
Albumin: 4 g/dL (ref 3.5–5.2)
Alkaline Phosphatase: 28 U/L — ABNORMAL LOW (ref 39–117)
Bilirubin, Direct: 0.1 mg/dL (ref 0.0–0.3)
Total Bilirubin: 0.5 mg/dL (ref 0.2–1.2)
Total Protein: 7 g/dL (ref 6.0–8.3)

## 2019-07-27 NOTE — Progress Notes (Signed)
   Subjective:    Patient ID: Laura Munoz, female    DOB: October 10, 1956, 63 y.o.   MRN: [id number]  HPI CPE- UTD on pap, mammo, due for repeat colonoscopy this year.  UTD on Tdap.  Is waiting on flu shot.   Review of Systems Patient reports no vision/ hearing changes, adenopathy,fever, weight change,  persistant/recurrent hoarseness , swallowing issues, chest pain, palpitations, edema, persistant/recurrent cough, hemoptysis, dyspnea (rest/exertional/paroxysmal nocturnal), gastrointestinal bleeding (melena, rectal bleeding), abdominal pain, significant heartburn, bowel changes, GU symptoms (dysuria, hematuria, incontinence), Gyn symptoms (abnormal  bleeding, pain),  syncope, focal weakness, memory loss, numbness & tingling, skin/hair/nail changes, abnormal bruising or bleeding, anxiety, or depression.     Objective:   Physical Exam General Appearance:    Alert, cooperative, no distress, appears stated age  Head:    Normocephalic, without obvious abnormality, atraumatic  Eyes:    PERRL, conjunctiva/corneas clear, EOM's intact, fundi    benign, both eyes  Ears:    Normal TM's and external ear canals, both ears  Nose:   Nares normal, septum midline, mucosa normal, no drainage    or sinus tenderness  Throat:   Lips, mucosa, and tongue normal; teeth and gums normal  Neck:   Supple, symmetrical, trachea midline, no adenopathy;    Thyroid: no enlargement/tenderness/nodules  Back:     Symmetric, no curvature, ROM normal, no CVA tenderness  Lungs:     Clear to auscultation bilaterally, respirations unlabored  Chest Wall:    No tenderness or deformity   Heart:    Regular rate and rhythm, S1 and S2 normal, no murmur, rub   or gallop  Breast Exam:    Deferred to GYN  Abdomen:     Soft, non-tender, bowel sounds active all four quadrants,    no masses, no organomegaly  Genitalia:    Deferred to GYN  Rectal:    Extremities:   Extremities normal, atraumatic, no cyanosis or edema  Pulses:   2+  and symmetric all extremities  Skin:   Skin color, texture, turgor normal, no rashes or lesions  Lymph nodes:   Cervical, supraclavicular, and axillary nodes normal  Neurologic:   CNII-XII intact, normal strength, sensation and reflexes    throughout          Assessment & Plan:

## 2019-07-27 NOTE — Assessment & Plan Note (Signed)
Pt's PE WNL.  UTD on GYN, due for colonoscopy later this year.  Pt plans to get flu shot later next month.  Check labs.  Anticipatory guidance provided.

## 2019-07-27 NOTE — Patient Instructions (Addendum)
Follow up in 1 year or as needed We'll notify you of your lab results and make any changes if needed Keep up the good work on healthy diet and regular exercise- you look great!!! Call with any questions or concerns Stay Safe!!! 

## 2019-07-28 ENCOUNTER — Encounter: Payer: Self-pay | Admitting: Family Medicine

## 2019-07-28 ENCOUNTER — Encounter: Payer: Self-pay | Admitting: General Practice

## 2019-08-18 ENCOUNTER — Other Ambulatory Visit: Payer: Self-pay

## 2019-08-18 ENCOUNTER — Ambulatory Visit: Payer: BLUE CROSS/BLUE SHIELD | Admitting: Sports Medicine

## 2019-08-18 ENCOUNTER — Encounter: Payer: Self-pay | Admitting: Sports Medicine

## 2019-08-18 ENCOUNTER — Ambulatory Visit: Payer: Self-pay

## 2019-08-18 VITALS — BP 106/70 | Ht 63.0 in | Wt 110.0 lb

## 2019-08-18 DIAGNOSIS — M25562 Pain in left knee: Secondary | ICD-10-CM

## 2019-08-18 DIAGNOSIS — M23204 Derangement of unspecified medial meniscus due to old tear or injury, left knee: Secondary | ICD-10-CM

## 2019-08-18 NOTE — Progress Notes (Signed)
Subjective:    Patient ID: Laura Munoz, female    DOB: 1956-07-19, 63 y.o.   MRN: [id number]  HPI  63 year old who presents with left knee pain.  Patient states that her knee has been hurting her for the last 3 months or so.  There was no specific inciting event.  She states that the pain does ache at night occasionally wakes her up.  Is described as a fullness in her anterior knee.  States the pain is worse when going downstairs.  She exercises 6 times per week.  She states that she alternates stretching based exercises and pain muscle strengthening exercises on for days.  Started on the origin of her pain she did start doing squats.  She states that she always tries to get the 90 degrees to the squat this does cause crease pain.  She takes Aleve infrequently pain usually bad states that it does help a little bit.  She states that usually the pain is a 2 out of 10 but occasionally can get up to a 5 or 6 out of 10.  Of note patient has a history of a torn soleus muscle as well as plantar fascitis on the same leg.   Review of Systems Per HPI  Past medical, past surgical histories reviewed.  Social history and allergies reviewed.    Objective:   Physical Exam General: Very pleasant 63 year old female, no acute distress BP 106/70   Ht 5\' 3"  (1.6 m)   Wt 110 lb (49.9 kg)   LMP 10/23/2013   BMI 19.49 kg/m   Respiratory: Able speak in clear coherent sentences with no accessory muscle use Cardiac: Skin warm and dry  Left knee Inspection: No swelling or focal asymmetry noted as compared to right knee Palpation: Very mild tenderness to palpation medial joint line, anterior joint line Range of motion: Flexion extension without abnormality. Strength: 5/5 strength extension and flexion knee. Stability: No instability appreciated Neurovascular: Skin warm and dry, sensation intact all nerve distributions Special test: Negative McMurray sign, negative anterior drawer, negative posterior  drawer, negative patellar grind test  Left hip  Inspection: No asymmetry compared to the right hip Palpation: Not performed Range of motion: ROM full in all directions (IR: 80/ ER: 80/Flex: 120/Ext: 100/Abd: 45/Add: 45 Strength:  5/5 in IR/ER/Flex/Ext/Abd/Add.  Stability: No sign of instability Neurovascular: Skin warm and dry, no focal neurologic abnormality appreciated  Left knee ultrasound:  Findings Medial meniscus with small calcification noted.  Small area of disruption to medial meniscus noted with surrounding hypoechoic findings consistent with fluid.   Patellar tendon and quadriceps tendon intact without any obvious hypoechoic changes or calcifications.   Lateral meniscus without any disruption or surrounding fluid.   No fluid within the joint or SPP to suggest effusion.   Patient with hypoechoic area inferior to semimembranosus. - about 2 x 1 cm oval  Impression Mild degenerative tear of the medial meniscus.  Fluid noted inferior to semimembranosus consistent with bursitis.  Ultrasound and interpretation by Wolfgang Phoenix. Fields, MD      Assessment & Plan:  Assessment 63 year old female presents with left knee pain.  Patient's pain is located on her medial joint line posterior knee.  Given her exam findings, history, and ultrasound findings patient likely with small degenerative disruption in her meniscus.  She is likely exacerbating her pain with her squats.  She is likely been compensating somewhat which led to the semimembranosus bursitis seen on ultrasound.  This would explain the fullness in  her posterior knee.  Patient with mild to moderate amount of fluid seen around this degenerative area in her medial meniscus which is likely the fullness she is been feeling in the mornings.  Plan Fit patient with knee compression sleeve to help with swelling Encouraged her to not go all the way to 90 with her squats Substitute some stationary biking Can ice the area as needed  Follow-up as needed  Guadalupe Dawn MD PGY-3 Family Medicine Resident  I observed and examined the patient with the resident and agree with assessment and plan.  Note reviewed and modified by me. Ila Mcgill, MD

## 2019-08-20 DIAGNOSIS — M23204 Derangement of unspecified medial meniscus due to old tear or injury, left knee: Secondary | ICD-10-CM | POA: Insufficient documentation

## 2019-09-30 DIAGNOSIS — H0100B Unspecified blepharitis left eye, upper and lower eyelids: Secondary | ICD-10-CM | POA: Diagnosis not present

## 2019-09-30 DIAGNOSIS — H5712 Ocular pain, left eye: Secondary | ICD-10-CM | POA: Diagnosis not present

## 2019-09-30 DIAGNOSIS — H0014 Chalazion left upper eyelid: Secondary | ICD-10-CM | POA: Diagnosis not present

## 2019-09-30 DIAGNOSIS — H25812 Combined forms of age-related cataract, left eye: Secondary | ICD-10-CM | POA: Diagnosis not present

## 2019-10-13 ENCOUNTER — Encounter: Payer: Self-pay | Admitting: Gastroenterology

## 2019-10-26 ENCOUNTER — Other Ambulatory Visit: Payer: Self-pay

## 2019-10-26 ENCOUNTER — Encounter: Payer: Self-pay | Admitting: Gastroenterology

## 2019-10-27 ENCOUNTER — Encounter

## 2019-10-27 ENCOUNTER — Encounter: Payer: Self-pay | Admitting: Obstetrics & Gynecology

## 2019-10-27 ENCOUNTER — Ambulatory Visit: Payer: BLUE CROSS/BLUE SHIELD | Admitting: Obstetrics & Gynecology

## 2019-10-27 VITALS — BP 108/68 | HR 76 | Temp 97.3°F | Resp 12 | Ht 62.5 in | Wt 109.2 lb

## 2019-10-27 DIAGNOSIS — Z01419 Encounter for gynecological examination (general) (routine) without abnormal findings: Secondary | ICD-10-CM

## 2019-10-27 MED ORDER — SERTRALINE HCL 100 MG PO TABS: 100.0000 mg | ORAL_TABLET | Freq: Every day | ORAL | 4 refills | Status: DC

## 2019-10-27 NOTE — Progress Notes (Signed)
63 y.o. G0P0000 Married White or Caucasian female here for annual exam.  Saw Dr. Birdie Riddle in August, 2020.  Had blood work done at this visit.  Has gotten a flu shot.    Denies vaginal bleeding.    Both sisters have been diagnosed with osteoporosis.    Patient's last menstrual period was 10/23/2013.          Sexually active: No.  The current method of family planning is post menopausal status.    Exercising: Yes.    jazzercise Smoker:  no  Health Maintenance: Pap:  04/05/17 Neg. HR HPV:neg              01/09/16 Neg. HR HPV:neg  History of abnormal Pap:  no MMG:  07/20/19 BIRADS 1 negative/density c Colonoscopy:  11/03/09 polyps. F/u 10 years -- scheduled 11/17/19 with Dr. Fuller Plan BMD:   05/02/16 Osteopenia, -1.2 TDaP:  07/20/18 Pneumonia vaccine(s):  none Shingrix:   Completed Hep C testing: 04/05/17 Negative Screening Labs: PCP   reports that she has never smoked. She has never used smokeless tobacco. She reports current alcohol use of about 2.0 standard drinks of alcohol per week. She reports that she does not use drugs.  Past Medical History:  Diagnosis Date  . At risk for decreased bone density    DEXA 03/2011 normal (-1.0 at R fem)  . Breast nodule 04/1996   left  . Depression    reactive  . Hx of colonic polyps   . Iliotibial band syndrome of right side 09/2011  . Myalgia    back and hips   . Talipes cavus 02/2010    Past Surgical History:  Procedure Laterality Date  . DILATATION & CURRETTAGE/HYSTEROSCOPY WITH RESECTOCOPE N/A 07/25/2015   Procedure: DILATATION & CURETTAGE/HYSTEROSCOPY;  Surgeon: Megan Salon, MD;  Location: [facility];  Service: Gynecology;  Laterality: N/A;  . NO PAST SURGERIES      Current Outpatient Medications  Medication Sig Dispense Refill  . Calcium Carbonate-Vit D-Min (CALCIUM 1200 PO) Take by mouth daily.      . cyanocobalamin 100 MCG tablet Take 100 mcg by mouth daily.    Marland Kitchen estrogen, conjugated,-medroxyprogesterone (PREMPRO) 0.45-1.5 MG tablet Take 1  tablet by mouth daily. 90 tablet 1  . Multiple Vitamin (MULTIVITAMIN) tablet Take 1 tablet by mouth daily.      Marland Kitchen pyridOXINE (VITAMIN B-6) 100 MG tablet Take 100 mg by mouth daily.    . sertraline (ZOLOFT) 100 MG tablet Take 1 tablet (100 mg total) by mouth daily. (Patient taking differently: Take 100 mg by mouth daily. taking every other day) 90 tablet 4   No current facility-administered medications for this visit.     Family History  Problem Relation Age of Onset  . Prostate cancer Father   . Dementia Father   . Osteopenia Mother   . Parkinson's disease Mother   . Breast cancer Paternal Grandmother   . Asthma Sister   . Early death Brother   . Coronary artery disease Maternal Grandmother   . Cancer Maternal Grandfather   . Asthma Sister     Review of Systems  All other systems reviewed and are negative.   Exam:   BP 108/68 (BP Location: Right Arm, Patient Position: Sitting, Cuff Size: Normal)   Pulse 76   Temp (!) 97.3 F (36.3 C) (Temporal)   Resp 12   Ht 5' 2.5" (1.588 m)   Wt 109 lb 3.2 oz (49.5 kg)   LMP 10/23/2013   BMI  19.65 kg/m   Height: 5' 2.5" (158.8 cm)  Ht Readings from Last 3 Encounters:  10/27/19 5' 2.5" (1.588 m)  08/18/19 5\' 3"  (1.6 m)  07/27/19 5\' 3"  (1.6 m)    General appearance: alert, cooperative and appears stated age Head: Normocephalic, without obvious abnormality, atraumatic Neck: no adenopathy, supple, symmetrical, trachea midline and thyroid normal to inspection and palpation Lungs: clear to auscultation bilaterally Breasts: normal appearance, no masses or tenderness Heart: regular rate and rhythm Abdomen: soft, non-tender; bowel sounds normal; no masses,  no organomegaly Extremities: extremities normal, atraumatic, no cyanosis or edema Skin: Skin color, texture, turgor normal. No rashes or lesions Lymph nodes: Cervical, supraclavicular, and axillary nodes normal. No abnormal inguinal nodes palpated Neurologic: Grossly  normal   Pelvic: External genitalia:  no lesions              Urethra:  normal appearing urethra with no masses, tenderness or lesions              Bartholins and Skenes: normal                 Vagina: normal appearing vagina with normal color and discharge, no lesions              Cervix: no lesions              Pap taken: No. Bimanual Exam:  Uterus:  normal size, contour, position, consistency, mobility, non-tender              Adnexa: normal adnexa and no mass, fullness, tenderness               Rectovaginal: Confirms               Anus:  normal sphincter tone, no lesions  Chaperone was present for exam.  A:  Well Woman with normal exam PMP, on HRT H/o prediabetes with normal HbA1C at last visit  P:   Mammogram guidelines reviewed.  Doing 3D. pap smear with neg HR HPV 2018.  Not indicated today.  New guidelines reviewed. Will call when needs RF for HRT RF for Zoloft 100mg  1/2 tab every other day.  #90/4RF Lab work done with Dr. Birdie Riddle in August Return annually or prn

## 2019-11-05 ENCOUNTER — Encounter: Payer: Self-pay | Admitting: Gastroenterology

## 2019-11-05 ENCOUNTER — Other Ambulatory Visit: Payer: Self-pay

## 2019-11-05 ENCOUNTER — Ambulatory Visit (AMBULATORY_SURGERY_CENTER): Payer: BLUE CROSS/BLUE SHIELD

## 2019-11-05 VITALS — Temp 96.9°F | Ht 62.5 in | Wt 109.2 lb

## 2019-11-05 DIAGNOSIS — Z1159 Encounter for screening for other viral diseases: Secondary | ICD-10-CM

## 2019-11-05 DIAGNOSIS — Z1211 Encounter for screening for malignant neoplasm of colon: Secondary | ICD-10-CM

## 2019-11-05 MED ORDER — NA SULFATE-K SULFATE-MG SULF 17.5-3.13-1.6 GM/177ML PO SOLN: 1.0000 | Freq: Once | ORAL | 0 refills | Status: AC

## 2019-11-05 NOTE — Progress Notes (Signed)
Denies allergies to eggs or soy products. Denies complication of anesthesia or sedation. Denies use of weight loss medication. Denies use of O2.   Emmi instructions given for colonoscopy.  Covid screening is scheduled for 11/12/19 @ 2:00 Pm. A 15.00 coupon for Suprep was given to the patient.

## 2019-11-12 ENCOUNTER — Ambulatory Visit (INDEPENDENT_AMBULATORY_CARE_PROVIDER_SITE_OTHER): Payer: BLUE CROSS/BLUE SHIELD

## 2019-11-12 ENCOUNTER — Other Ambulatory Visit: Payer: Self-pay | Admitting: Gastroenterology

## 2019-11-12 DIAGNOSIS — Z1159 Encounter for screening for other viral diseases: Secondary | ICD-10-CM | POA: Diagnosis not present

## 2019-11-12 LAB — SARS CORONAVIRUS 2 (TAT 6-24 HRS): SARS Coronavirus 2: NEGATIVE

## 2019-11-15 ENCOUNTER — Encounter: Payer: Self-pay | Admitting: Obstetrics & Gynecology

## 2019-11-15 ENCOUNTER — Other Ambulatory Visit: Payer: Self-pay | Admitting: Obstetrics & Gynecology

## 2019-11-15 MED ORDER — PREMPRO 0.45-1.5 MG PO TABS: 1.0000 | ORAL_TABLET | Freq: Every day | ORAL | 4 refills | Status: DC

## 2019-11-17 ENCOUNTER — Other Ambulatory Visit: Payer: Self-pay

## 2019-11-17 ENCOUNTER — Encounter: Payer: Self-pay | Admitting: Gastroenterology

## 2019-11-17 ENCOUNTER — Ambulatory Visit (AMBULATORY_SURGERY_CENTER): Payer: BLUE CROSS/BLUE SHIELD | Admitting: Gastroenterology

## 2019-11-17 VITALS — BP 111/69 | HR 51 | Temp 97.1°F | Resp 18 | Ht 62.5 in | Wt 109.2 lb

## 2019-11-17 DIAGNOSIS — D123 Benign neoplasm of transverse colon: Secondary | ICD-10-CM

## 2019-11-17 DIAGNOSIS — K6389 Other specified diseases of intestine: Secondary | ICD-10-CM | POA: Diagnosis not present

## 2019-11-17 DIAGNOSIS — Z1211 Encounter for screening for malignant neoplasm of colon: Secondary | ICD-10-CM | POA: Diagnosis not present

## 2019-11-17 DIAGNOSIS — K635 Polyp of colon: Secondary | ICD-10-CM

## 2019-11-17 MED ORDER — SODIUM CHLORIDE 0.9 % IV SOLN: 500.0000 mL | Freq: Once | INTRAVENOUS | Status: DC

## 2019-11-17 NOTE — Patient Instructions (Signed)
HANDOUTS PROVIDED ON: POLYPS  The polyps removed today have been sent for pathology.  The results can take 1-3 weeks to receive.  When your next colonoscopy should occur will be based on the pathology results.    You may resume your previous diet and medication schedule.  Thank you for allowing us to care for you today!!!  YOU HAD AN ENDOSCOPIC PROCEDURE TODAY AT THE [facility]:   Refer to the procedure report that was given to you for any specific questions about what was found during the examination.  If the procedure report does not answer your questions, please call your gastroenterologist to clarify.  If you requested that your care partner not be given the details of your procedure findings, then the procedure report has been included in a sealed envelope for you to review at your convenience later.  YOU SHOULD EXPECT: Some feelings of bloating in the abdomen. Passage of more gas than usual.  Walking can help get rid of the air that was put into your GI tract during the procedure and reduce the bloating. If you had a lower endoscopy (such as a colonoscopy or flexible sigmoidoscopy) you may notice spotting of blood in your stool or on the toilet paper. If you underwent a bowel prep for your procedure, you may not have a normal bowel movement for a few days.  Please Note:  You might notice some irritation and congestion in your nose or some drainage.  This is from the oxygen used during your procedure.  There is no need for concern and it should clear up in a day or so.  SYMPTOMS TO REPORT IMMEDIATELY:   Following lower endoscopy (colonoscopy or flexible sigmoidoscopy):  Excessive amounts of blood in the stool  Significant tenderness or worsening of abdominal pains  Swelling of the abdomen that is new, acute  Fever of 100F or higher  For urgent or emergent issues, a gastroenterologist can be reached at any hour by calling [phone].   DIET:  We do recommend a small  meal at first, but then you may proceed to your regular diet.  Drink plenty of fluids but you should avoid alcoholic beverages for 24 hours.  ACTIVITY:  You should plan to take it easy for the rest of today and you should NOT DRIVE or use heavy machinery until tomorrow (because of the sedation medicines used during the test).    FOLLOW UP: Our staff will call the number listed on your records 48-72 hours following your procedure to check on you and address any questions or concerns that you may have regarding the information given to you following your procedure. If we do not reach you, we will leave a message.  We will attempt to reach you two times.  During this call, we will ask if you have developed any symptoms of COVID 19. If you develop any symptoms (ie: fever, flu-like symptoms, shortness of breath, cough etc.) before then, please call [phone].  If you test positive for Covid 19 in the 2 weeks post procedure, please call and report this information to us.    If any biopsies were taken you will be contacted by phone or by letter within the next 1-3 weeks.  Please call us at [phone] if you have not heard about the biopsies in 3 weeks.    SIGNATURES/CONFIDENTIALITY: You and/or your care partner have signed paperwork which will be entered into your electronic medical record.  These signatures attest to the   fact that that the information above on your After Visit Summary has been reviewed and is understood.  Full responsibility of the confidentiality of this discharge information lies with you and/or your care-partner.

## 2019-11-17 NOTE — Progress Notes (Signed)
PT taken to PACU. Monitors in place. VSS. Report given to RN. 

## 2019-11-17 NOTE — Op Note (Signed)
[facility] Patient Name: Laura Munoz Procedure Date: 11/17/2019 9:13 AM MRN: [id number] Endoscopist: Ladene Artist , MD Age: 63 Referring MD:  Date of Birth: [birth year] Gender: Female Account #: [id number] Procedure:                Colonoscopy Indications:              Screening for colorectal malignant neoplasm Medicines:                Monitored Anesthesia Care Procedure:                Pre-Anesthesia Assessment:                           - Prior to the procedure, a History and Physical                            was performed, and patient medications and                            allergies were reviewed. The patient's tolerance of                            previous anesthesia was also reviewed. The risks                            and benefits of the procedure and the sedation                            options and risks were discussed with the patient.                            All questions were answered, and informed consent                            was obtained. Prior Anticoagulants: The patient has                            taken no previous anticoagulant or antiplatelet                            agents. ASA Grade Assessment: II - A patient with                            mild systemic disease. After reviewing the risks                            and benefits, the patient was deemed in                            satisfactory condition to undergo the procedure.                           After obtaining informed consent, the colonoscope  was passed under direct vision. Throughout the                            procedure, the patient's blood pressure, pulse, and                            oxygen saturations were monitored continuously. The                            Colonoscope was introduced through the anus and                            advanced to the the cecum, identified by                            appendiceal orifice and  ileocecal valve. The                            ileocecal valve, appendiceal orifice, and rectum                            were photographed. The quality of the bowel                            preparation was good. The colonoscopy was performed                            without difficulty. The patient tolerated the                            procedure well. Scope In: 9:22:25 AM Scope Out: 9:42:14 AM Scope Withdrawal Time: 0 hours 14 minutes 2 seconds  Total Procedure Duration: 0 hours 19 minutes 49 seconds  Findings:                 The perianal and digital rectal examinations were                            normal.                           Two sessile polyps were found in the transverse                            colon. The polyps were 6 mm in size. These polyps                            were removed with a cold snare. Resection and                            retrieval were complete.                           The exam was otherwise without abnormality on  direct and retroflexion views. Complications:            No immediate complications. Estimated blood loss:                            None. Estimated Blood Loss:     Estimated blood loss: none. Impression:               - Two 6 mm polyps in the transverse colon, removed                            with a cold snare. Resected and retrieved.                           - The examination was otherwise normal on direct                            and retroflexion views. Recommendation:           - Repeat colonoscopy after studies are complete for                            surveillance based on pathology results.                           - Patient has a contact number available for                            emergencies. The signs and symptoms of potential                            delayed complications were discussed with the                            patient. Return to normal activities tomorrow.                             Written discharge instructions were provided to the                            patient.                           - Resume previous diet.                           - Continue present medications.                           - Await pathology results. Ladene Artist, MD 11/17/2019 9:44:51 AM This report has been signed electronically.

## 2019-11-19 ENCOUNTER — Telehealth: Payer: Self-pay | Admitting: *Deleted

## 2019-11-19 NOTE — Telephone Encounter (Signed)
  Follow up Call-  Call back number 11/17/2019  Post procedure Call Back phone  # [phone]  Permission to leave phone message Yes  Some recent data might be hidden     Patient questions:  Do you have a fever, pain , or abdominal swelling? No. Pain Score  0 *  Have you tolerated food without any problems? Yes.    Have you been able to return to your normal activities? Yes.    Do you have any questions about your discharge instructions: Diet   No. Medications  No. Follow up visit  No.  Do you have questions or concerns about your Care? No.  Actions: * If pain score is 4 or above: No action needed, pain <4.  1. Have you developed a fever since your procedure? no  2.   Have you had an respiratory symptoms (SOB or cough) since your procedure? no  3.   Have you tested positive for COVID 19 since your procedure no  4.   Have you had any family members/close contacts diagnosed with the COVID 19 since your procedure?  no   If yes to any of these questions please route to Joylene John, RN and Alphonsa Gin, Therapist, sports.

## 2019-11-25 ENCOUNTER — Encounter: Payer: Self-pay | Admitting: Gastroenterology

## 2019-11-27 HISTORY — PX: MOHS SURGERY: SHX181

## 2020-01-26 ENCOUNTER — Ambulatory Visit: Payer: Self-pay | Attending: Internal Medicine

## 2020-01-26 DIAGNOSIS — Z23 Encounter for immunization: Secondary | ICD-10-CM | POA: Insufficient documentation

## 2020-01-26 NOTE — Progress Notes (Signed)
   Covid-19 Vaccination Clinic  Name:  Laura Munoz    MRN: [id number] DOB: 1956-08-09  01/26/2020  [name] was observed post Covid-19 immunization for 15 minutes without incident. She was provided with Vaccine Information Sheet and instruction to access the V-Safe system.   [name] was instructed to call 911 with any severe reactions post vaccine: Marland Kitchen Difficulty breathing  . Swelling of face and throat  . A fast heartbeat  . A bad rash all over body  . Dizziness and weakness   Immunizations Administered    Name Date Dose VIS Date Route   Pfizer COVID-19 Vaccine 01/26/2020 11:06 AM 0.3 mL 11/06/2019 Intramuscular   Manufacturer: [place]   Lot: [id number]   [place]: [id number]

## 2020-02-16 ENCOUNTER — Ambulatory Visit: Payer: Self-pay | Attending: Internal Medicine

## 2020-02-16 DIAGNOSIS — Z23 Encounter for immunization: Secondary | ICD-10-CM

## 2020-02-16 NOTE — Progress Notes (Signed)
   Covid-19 Vaccination Clinic  Name:  Laura Munoz    MRN: [id number] DOB: 1956/06/18  02/16/2020  [name] was observed post Covid-19 immunization for 15 minutes without incident. She was provided with Vaccine Information Sheet and instruction to access the V-Safe system.   [name] was instructed to call 911 with any severe reactions post vaccine: Marland Kitchen Difficulty breathing  . Swelling of face and throat  . A fast heartbeat  . A bad rash all over body  . Dizziness and weakness   Immunizations Administered    Name Date Dose VIS Date Route   Pfizer COVID-19 Vaccine 02/16/2020  8:42 AM 0.3 mL 11/06/2019 Intramuscular   Manufacturer: [place]   Lot: [id number]   [place]: [id number]

## 2020-04-06 ENCOUNTER — Encounter: Payer: Self-pay | Admitting: Obstetrics & Gynecology

## 2020-04-26 DIAGNOSIS — L570 Actinic keratosis: Secondary | ICD-10-CM | POA: Diagnosis not present

## 2020-04-26 DIAGNOSIS — D225 Melanocytic nevi of trunk: Secondary | ICD-10-CM | POA: Diagnosis not present

## 2020-04-26 DIAGNOSIS — L905 Scar conditions and fibrosis of skin: Secondary | ICD-10-CM | POA: Diagnosis not present

## 2020-04-26 DIAGNOSIS — L821 Other seborrheic keratosis: Secondary | ICD-10-CM | POA: Diagnosis not present

## 2020-04-26 DIAGNOSIS — L814 Other melanin hyperpigmentation: Secondary | ICD-10-CM | POA: Diagnosis not present

## 2020-05-23 ENCOUNTER — Encounter: Payer: Self-pay | Admitting: Obstetrics & Gynecology

## 2020-05-23 ENCOUNTER — Telehealth: Payer: Self-pay | Admitting: Obstetrics & Gynecology

## 2020-05-23 NOTE — Telephone Encounter (Signed)
Laura Belt "[name]" sent to [facility] Dr. Sabra Heck,  As you may recall, my Prempro will no longer be covered by [employer]. I realize that I would need to take 2 pills as a generic replacement; estrogen and progesterone. In our previous exchange, you stated there is a generic for the progesterone, but not an exact generic equivalent for the estrogen.  I would have to "try" the generic before you could "petition" the insurance company. I am not crazy about "experimenting" with my body. [place] just called as I am now ready for a refill to tell me that Prempro is no longer covered. I was ready to pay the $600 for a 90-day supply. They suggested petitioning now with "Cover My Meds", a portal that physicians use. They indicated that you could log in and see my information, and petition [employer] by indicating how long I have been on this drug, etc. I have no idea if you participate in this or if this is the "procedure" your office uses. I am so sorry for the hassle. My [employer] ID  #[id number]. The group is [id number]. The [place] # is [phone]. Please let me know your thoughts. Again, my humblest apology. Laura Munoz

## 2020-05-23 NOTE — Telephone Encounter (Signed)
Spoke with patient. Patient request to proceed with PA for Prempro.  Patient reports she has been on medication since 2011.  Has not tried any alternatives. Reduced from Prempro 0.625-5mg  to 0.45-1.5mg  in 2017.   Advised patient will submit PA to plan, advised can take 5 business days for response, will notify of response. Patient agreeable.   PA for Prempro 0.45-1.5mg  tab submitted to plan via [email or web address] Key: [id number] - PA Case ID: [id number] - Rx #: [id number]

## 2020-05-26 DIAGNOSIS — L821 Other seborrheic keratosis: Secondary | ICD-10-CM | POA: Diagnosis not present

## 2020-05-26 DIAGNOSIS — D485 Neoplasm of uncertain behavior of skin: Secondary | ICD-10-CM | POA: Diagnosis not present

## 2020-05-26 DIAGNOSIS — C4401 Basal cell carcinoma of skin of lip: Secondary | ICD-10-CM | POA: Diagnosis not present

## 2020-05-26 DIAGNOSIS — L570 Actinic keratosis: Secondary | ICD-10-CM | POA: Diagnosis not present

## 2020-05-31 NOTE — Telephone Encounter (Signed)
Patient will be out of her Prempro on Saturday and wonders if she should get 1 refill locally while waiting for PA on the Prempro? She ask if getting one refill will interfere  with the PA?

## 2020-06-02 NOTE — Telephone Encounter (Signed)
PA through Covermymeds has not resulted.  Called [employer] to check on PA.  Spoke with Neoma Laming at [facility]  Case still open, completed today.  PA denied. Will send fax of denial and will give alt meds.

## 2020-06-02 NOTE — Telephone Encounter (Signed)
Left message for pt to return call to triage RN. 

## 2020-06-02 NOTE — Telephone Encounter (Signed)
Spoke with pt. Pt given update on denied PA. Pt states paid out of pocket for 90 day supply refill of Prempro on 06/01/20. Pt states ok with denial. Pt denies trying any alternative meds at this time. States plans to change insurance in a month and see if Prempro will be covered. Pt will return call to office with new updated insurance plan. Pt expressed thanks for all work done for PA.   Routing to Dr Sabra Heck for review.  Encounter closed.

## 2020-06-03 ENCOUNTER — Other Ambulatory Visit: Payer: Self-pay | Admitting: Obstetrics & Gynecology

## 2020-06-03 DIAGNOSIS — Z1231 Encounter for screening mammogram for malignant neoplasm of breast: Secondary | ICD-10-CM

## 2020-06-06 DIAGNOSIS — D485 Neoplasm of uncertain behavior of skin: Secondary | ICD-10-CM | POA: Diagnosis not present

## 2020-06-06 DIAGNOSIS — L739 Follicular disorder, unspecified: Secondary | ICD-10-CM | POA: Diagnosis not present

## 2020-06-07 ENCOUNTER — Encounter: Payer: Self-pay | Admitting: Family Medicine

## 2020-06-07 DIAGNOSIS — C4401 Basal cell carcinoma of skin of lip: Secondary | ICD-10-CM | POA: Diagnosis not present

## 2020-06-30 DIAGNOSIS — H0011 Chalazion right upper eyelid: Secondary | ICD-10-CM | POA: Diagnosis not present

## 2020-07-01 DIAGNOSIS — L820 Inflamed seborrheic keratosis: Secondary | ICD-10-CM | POA: Diagnosis not present

## 2020-07-01 DIAGNOSIS — L821 Other seborrheic keratosis: Secondary | ICD-10-CM | POA: Diagnosis not present

## 2020-07-01 DIAGNOSIS — Z85828 Personal history of other malignant neoplasm of skin: Secondary | ICD-10-CM | POA: Diagnosis not present

## 2020-07-01 DIAGNOSIS — L905 Scar conditions and fibrosis of skin: Secondary | ICD-10-CM | POA: Diagnosis not present

## 2020-07-01 DIAGNOSIS — H0014 Chalazion left upper eyelid: Secondary | ICD-10-CM | POA: Diagnosis not present

## 2020-07-01 DIAGNOSIS — L309 Dermatitis, unspecified: Secondary | ICD-10-CM | POA: Diagnosis not present

## 2020-07-12 DIAGNOSIS — L905 Scar conditions and fibrosis of skin: Secondary | ICD-10-CM | POA: Diagnosis not present

## 2020-07-12 DIAGNOSIS — L738 Other specified follicular disorders: Secondary | ICD-10-CM | POA: Diagnosis not present

## 2020-07-12 DIAGNOSIS — L728 Other follicular cysts of the skin and subcutaneous tissue: Secondary | ICD-10-CM | POA: Diagnosis not present

## 2020-07-12 DIAGNOSIS — D485 Neoplasm of uncertain behavior of skin: Secondary | ICD-10-CM | POA: Diagnosis not present

## 2020-07-12 DIAGNOSIS — L821 Other seborrheic keratosis: Secondary | ICD-10-CM | POA: Diagnosis not present

## 2020-07-15 DIAGNOSIS — H0011 Chalazion right upper eyelid: Secondary | ICD-10-CM | POA: Diagnosis not present

## 2020-07-18 ENCOUNTER — Ambulatory Visit: Payer: Self-pay

## 2020-07-26 DIAGNOSIS — H0011 Chalazion right upper eyelid: Secondary | ICD-10-CM | POA: Diagnosis not present

## 2020-07-29 DIAGNOSIS — D485 Neoplasm of uncertain behavior of skin: Secondary | ICD-10-CM | POA: Diagnosis not present

## 2020-07-29 DIAGNOSIS — L738 Other specified follicular disorders: Secondary | ICD-10-CM | POA: Diagnosis not present

## 2020-07-29 DIAGNOSIS — L821 Other seborrheic keratosis: Secondary | ICD-10-CM | POA: Diagnosis not present

## 2020-08-15 DIAGNOSIS — H0011 Chalazion right upper eyelid: Secondary | ICD-10-CM | POA: Diagnosis not present

## 2020-08-17 ENCOUNTER — Other Ambulatory Visit: Payer: Self-pay

## 2020-08-17 ENCOUNTER — Ambulatory Visit
Admission: RE | Admit: 2020-08-17 | Discharge: 2020-08-17 | Disposition: A | Discharge status: Home / Self Care | Payer: BC Managed Care – PPO | Source: Ambulatory Visit | Attending: Obstetrics & Gynecology | Admitting: Obstetrics & Gynecology

## 2020-08-17 DIAGNOSIS — Z1231 Encounter for screening mammogram for malignant neoplasm of breast: Secondary | ICD-10-CM

## 2020-08-18 ENCOUNTER — Other Ambulatory Visit: Payer: Self-pay | Admitting: Obstetrics & Gynecology

## 2020-08-18 DIAGNOSIS — R928 Other abnormal and inconclusive findings on diagnostic imaging of breast: Secondary | ICD-10-CM

## 2020-08-22 ENCOUNTER — Other Ambulatory Visit: Payer: BC Managed Care – PPO

## 2020-08-23 ENCOUNTER — Other Ambulatory Visit: Payer: Self-pay

## 2020-08-23 ENCOUNTER — Ambulatory Visit
Admission: RE | Admit: 2020-08-23 | Discharge: 2020-08-23 | Disposition: A | Discharge status: Home / Self Care | Payer: BC Managed Care – PPO | Source: Ambulatory Visit | Attending: Obstetrics & Gynecology | Admitting: Obstetrics & Gynecology

## 2020-08-23 DIAGNOSIS — R922 Inconclusive mammogram: Secondary | ICD-10-CM | POA: Diagnosis not present

## 2020-08-23 DIAGNOSIS — R928 Other abnormal and inconclusive findings on diagnostic imaging of breast: Secondary | ICD-10-CM

## 2020-08-23 DIAGNOSIS — N6012 Diffuse cystic mastopathy of left breast: Secondary | ICD-10-CM | POA: Diagnosis not present

## 2020-08-26 ENCOUNTER — Other Ambulatory Visit: Payer: BC Managed Care – PPO

## 2020-08-29 DIAGNOSIS — L821 Other seborrheic keratosis: Secondary | ICD-10-CM | POA: Diagnosis not present

## 2020-08-29 DIAGNOSIS — L308 Other specified dermatitis: Secondary | ICD-10-CM | POA: Diagnosis not present

## 2020-08-29 DIAGNOSIS — D485 Neoplasm of uncertain behavior of skin: Secondary | ICD-10-CM | POA: Diagnosis not present

## 2020-08-29 DIAGNOSIS — H0011 Chalazion right upper eyelid: Secondary | ICD-10-CM | POA: Diagnosis not present

## 2020-08-29 DIAGNOSIS — L905 Scar conditions and fibrosis of skin: Secondary | ICD-10-CM | POA: Diagnosis not present

## 2020-09-02 ENCOUNTER — Other Ambulatory Visit: Payer: BC Managed Care – PPO

## 2020-09-26 DIAGNOSIS — H0011 Chalazion right upper eyelid: Secondary | ICD-10-CM | POA: Diagnosis not present

## 2020-10-25 DIAGNOSIS — R238 Other skin changes: Secondary | ICD-10-CM | POA: Diagnosis not present

## 2020-10-25 DIAGNOSIS — I7300 Raynaud's syndrome without gangrene: Secondary | ICD-10-CM | POA: Diagnosis not present

## 2020-10-26 DIAGNOSIS — R238 Other skin changes: Secondary | ICD-10-CM | POA: Insufficient documentation

## 2020-10-26 DIAGNOSIS — I7300 Raynaud's syndrome without gangrene: Secondary | ICD-10-CM | POA: Insufficient documentation

## 2020-11-16 ENCOUNTER — Other Ambulatory Visit: Payer: Self-pay

## 2020-11-16 ENCOUNTER — Other Ambulatory Visit: Payer: Self-pay | Admitting: Podiatry

## 2020-11-16 ENCOUNTER — Ambulatory Visit: Payer: BC Managed Care – PPO | Admitting: Podiatry

## 2020-11-16 ENCOUNTER — Ambulatory Visit (INDEPENDENT_AMBULATORY_CARE_PROVIDER_SITE_OTHER): Payer: BC Managed Care – PPO

## 2020-11-16 DIAGNOSIS — M79672 Pain in left foot: Secondary | ICD-10-CM

## 2020-11-16 DIAGNOSIS — T148XXA Other injury of unspecified body region, initial encounter: Secondary | ICD-10-CM

## 2020-11-16 MED ORDER — SULFAMETHOXAZOLE-TRIMETHOPRIM 800-160 MG PO TABS: 1.0000 | ORAL_TABLET | Freq: Two times a day (BID) | ORAL | 0 refills | Status: DC

## 2020-11-17 ENCOUNTER — Encounter: Payer: Self-pay | Admitting: Podiatry

## 2020-11-17 NOTE — Progress Notes (Signed)
Subjective:  Patient ID: Laura Munoz, female    DOB: 07-10-1956,  MRN: [id number]  Chief Complaint  Patient presents with  . Foot Problem    Lt 5th toe w/ redness, swelling and pain x 1 mo; 7/ 10 burning, achy shoooting pain - pain only ifsomthing touches it - no injury -wrose with rubbing Tx: epsom, mupirocin and topical ointment form der.     64 y.o. female presents with the above complaint.  Patient presents with complaint of left fifth digit soft tissue contusion.  Patient states that there is pain associated with it.  Patient does not have any injury.  She has tried multiple over-the-counter treatment options as listed above.  This has been going on for quite some time and the redness comes and goes depending on activities.  She loves to  exercises on her foot every day.  She does not have any diabetes.   Review of Systems: Negative except as noted in the HPI. Denies N/V/F/Ch.  Past Medical History:  Diagnosis Date  . Breast nodule 04/1996   left  . Depression    reactive  . Iliotibial band syndrome of right side 09/2011  . Myalgia    back and hips   . Talipes cavus 02/2010    Current Outpatient Medications:  .  Calcium Carbonate-Vit D-Min (CALCIUM 1200 PO), Take by mouth daily.  , Disp: , Rfl:  .  cyanocobalamin 100 MCG tablet, Take 100 mcg by mouth daily., Disp: , Rfl:  .  estrogen, conjugated,-medroxyprogesterone (PREMPRO) 0.45-1.5 MG tablet, Take 1 tablet by mouth daily., Disp: 90 tablet, Rfl: 4 .  Multiple Vitamin (MULTIVITAMIN) tablet, Take 1 tablet by mouth daily.  , Disp: , Rfl:  .  pyridOXINE (VITAMIN B-6) 100 MG tablet, Take 100 mg by mouth daily., Disp: , Rfl:  .  sertraline (ZOLOFT) 100 MG tablet, Take 1 tablet (100 mg total) by mouth daily. (Patient taking differently: Take 50 mg by mouth daily.), Disp: 90 tablet, Rfl: 4 .  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 Chocolate   . Cocoa Other (See Comments)  . Codeine Other (See Comments)    Passes out  . Doxycycline Rash   Objective:  There were no vitals filed for this visit. There is no height or weight on file to calculate BMI. Constitutional Well developed. Well nourished.  Vascular Dorsalis pedis pulses palpable bilaterally. Posterior tibial pulses palpable bilaterally. Capillary refill normal to all digits.  No cyanosis or clubbing noted. Pedal hair growth normal.  Neurologic Normal speech. Oriented to person, place, and time. Epicritic sensation to light touch grossly present bilaterally.  Dermatologic Nails well groomed and normal in appearance. No open wounds. No skin lesions.  Orthopedic:  Mild pain on palpation to the fifth digit.  There is redness present that is not extending past the metatarsophalangeal joint to the left side.  No fluctuance crepitus noted at the fifth digit.  No epidermal lysis or itching associated with it.   Radiographs: 3 views of skeletally mature the left foot: No fracture noted no other osseous abnormalities noted x-ray within normal limits Assessment:   1. Contusion of soft tissue    Plan:  Patient was evaluated  and treated and all questions answered.  Left fifth digit soft tissue contusion -I explained the patient the etiology of contusion various treatment options were extensively discussed.  I discussed with the patient there could be multiple different component including vascular disease, infection, irritation from shoes.  At this time we will try to take the pressure off of the fifth digit by placing her surgical shoe to allow the soft tissue to rest.  We will also rule out infectious source by placing her and Bactrim antibiotics for 10  days twice daily.  Patient agrees with the plan -Surgical shoe was dispensed -Bactrim double strength was sent to the pharmacy  No follow-ups on file.

## 2020-11-21 ENCOUNTER — Telehealth: Payer: Self-pay

## 2020-11-21 ENCOUNTER — Other Ambulatory Visit: Payer: Self-pay | Admitting: Podiatry

## 2020-11-21 DIAGNOSIS — T148XXA Other injury of unspecified body region, initial encounter: Secondary | ICD-10-CM

## 2020-11-21 NOTE — Telephone Encounter (Signed)
Pt called today and stated that she will be out of her bactrim tablet in the morning and was wondering if she would need a new prescription. Also the patient stated that the medication hasnt helped her toe. Please advise.

## 2020-11-21 NOTE — Telephone Encounter (Signed)
No she would not need anymore Bactrim if it did not help the toe that is likely just a contusion of the soft tissue that needs time to heal sometimes up to 4 weeks with surgical shoe on.

## 2020-11-23 ENCOUNTER — Ambulatory Visit (INDEPENDENT_AMBULATORY_CARE_PROVIDER_SITE_OTHER): Payer: BC Managed Care – PPO | Admitting: Family Medicine

## 2020-11-23 ENCOUNTER — Encounter: Payer: Self-pay | Admitting: Family Medicine

## 2020-11-23 ENCOUNTER — Other Ambulatory Visit: Payer: Self-pay

## 2020-11-23 VITALS — BP 120/80 | HR 88 | Temp 97.8°F | Resp 15 | Ht 62.0 in | Wt 105.4 lb

## 2020-11-23 DIAGNOSIS — R634 Abnormal weight loss: Secondary | ICD-10-CM | POA: Diagnosis not present

## 2020-11-23 DIAGNOSIS — Z0000 Encounter for general adult medical examination without abnormal findings: Secondary | ICD-10-CM

## 2020-11-23 DIAGNOSIS — R238 Other skin changes: Secondary | ICD-10-CM

## 2020-11-23 LAB — CBC WITH DIFFERENTIAL/PLATELET
Basophils Absolute: 0 10*3/uL (ref 0.0–0.1)
Basophils Relative: 1.1 % (ref 0.0–3.0)
Eosinophils Absolute: 0.1 10*3/uL (ref 0.0–0.7)
Eosinophils Relative: 2 % (ref 0.0–5.0)
HCT: 38.9 % (ref 36.0–46.0)
Hemoglobin: 13.1 g/dL (ref 12.0–15.0)
Lymphocytes Relative: 30.7 % (ref 12.0–46.0)
Lymphs Abs: 1.2 10*3/uL (ref 0.7–4.0)
MCHC: 33.6 g/dL (ref 30.0–36.0)
MCV: 89.6 fl (ref 78.0–100.0)
Monocytes Absolute: 0.3 10*3/uL (ref 0.1–1.0)
Monocytes Relative: 7.4 % (ref 3.0–12.0)
Neutro Abs: 2.2 10*3/uL (ref 1.4–7.7)
Neutrophils Relative %: 58.8 % (ref 43.0–77.0)
Platelets: 284 10*3/uL (ref 150.0–400.0)
RBC: 4.34 Mil/uL (ref 3.87–5.11)
RDW: 13 % (ref 11.5–15.5)
WBC: 3.8 10*3/uL — ABNORMAL LOW (ref 4.0–10.5)

## 2020-11-23 LAB — HEPATIC FUNCTION PANEL
ALT: 21 U/L (ref 0–35)
AST: 27 U/L (ref 0–37)
Albumin: 4.4 g/dL (ref 3.5–5.2)
Alkaline Phosphatase: 33 U/L — ABNORMAL LOW (ref 39–117)
Bilirubin, Direct: 0.1 mg/dL (ref 0.0–0.3)
Total Bilirubin: 0.4 mg/dL (ref 0.2–1.2)
Total Protein: 7.4 g/dL (ref 6.0–8.3)

## 2020-11-23 LAB — LIPID PANEL
Cholesterol: 198 mg/dL (ref 0–200)
HDL: 75.1 mg/dL (ref >39.00)
LDL Cholesterol: 104 mg/dL — ABNORMAL HIGH (ref 0–99)
NonHDL: 123
Total CHOL/HDL Ratio: 3
Triglycerides: 97 mg/dL (ref 0.0–149.0)
VLDL: 19.4 mg/dL (ref 0.0–40.0)

## 2020-11-23 LAB — TSH: TSH: 3.74 u[IU]/mL (ref 0.35–4.50)

## 2020-11-23 LAB — BASIC METABOLIC PANEL
BUN: 21 mg/dL (ref 6–23)
CO2: 31 mEq/L (ref 19–32)
Calcium: 9.5 mg/dL (ref 8.4–10.5)
Chloride: 101 mEq/L (ref 96–112)
Creatinine, Ser: 1.06 mg/dL (ref 0.40–1.20)
GFR: 56.51 mL/min — ABNORMAL LOW (ref >60.00)
Glucose, Bld: 104 mg/dL — ABNORMAL HIGH (ref 70–99)
Potassium: 4.4 mEq/L (ref 3.5–5.1)
Sodium: 137 mEq/L (ref 135–145)

## 2020-11-23 LAB — VITAMIN D 25 HYDROXY (VIT D DEFICIENCY, FRACTURES): VITD: 61.53 ng/mL (ref 30.00–100.00)

## 2020-11-23 NOTE — Progress Notes (Signed)
   Subjective:    Patient ID: Laura Munoz, female    DOB: [birth year], 64 y.o.   MRN: [id number]  HPI CPE- UTD on pap, mammo, colonoscopy, Tdap, flu, COVID  Reviewed past medical, surgical, family and social histories.   Patient Care Team    Relationship Specialty Notifications Start End  Sheliah Hatch, MD PCP - General Family Medicine  02/06/18   Meryl Dare, MD Consulting Physician Gastroenterology  04/02/11   Enid Baas, MD Consulting Physician Family Medicine  04/02/11   Jerene Bears, MD Consulting Physician Gynecology  02/06/18   Cherlyn Roberts, MD Consulting Physician Dermatology  02/06/18     Health Maintenance  Topic Date Due  . PAP SMEAR-Modifier  12/07/2020 (Originally 04/05/2020)  . HIV Screening  11/23/2021 (Originally 11/25/1973)  . MAMMOGRAM  08/17/2022  . TETANUS/TDAP  07/20/2028  . COLONOSCOPY (Pts 45-30yrs Insurance coverage will need to be confirmed)  11/16/2029  . INFLUENZA VACCINE  Completed  . COVID-19 Vaccine  Completed  . Hepatitis C Screening  Completed      Review of Systems Patient reports no vision/ hearing changes, adenopathy,fever, weight change,  persistant/recurrent hoarseness , swallowing issues, chest pain, palpitations, edema, persistant/recurrent cough, hemoptysis, dyspnea (rest/exertional/paroxysmal nocturnal), gastrointestinal bleeding (melena, rectal bleeding), abdominal pain, significant heartburn, bowel changes, GU symptoms (dysuria, hematuria, incontinence), Gyn symptoms (abnormal  bleeding, pain),  syncope, focal weakness, memory loss, numbness & tingling, hair/nail changes, abnormal bruising or bleeding, anxiety, or depression.   L little toe red, swollen, painful since mid-November.  Saw 2 different podiatrists.  No fx.  Was placed on Bactrim- no change.  This visit occurred during the SARS-CoV-2 public health emergency.  Safety protocols were in place, including screening questions prior to the visit, additional usage of  staff PPE, and extensive cleaning of exam room while observing appropriate contact time as indicated for disinfecting solutions.       Objective:   Physical Exam General Appearance:    Alert, cooperative, no distress, appears stated age, thin  Head:    Normocephalic, without obvious abnormality, atraumatic  Eyes:    PERRL, conjunctiva/corneas clear, EOM's intact, fundi    benign, both eyes  Ears:    Normal TM's and external ear canals, both ears  Nose:   Deferred due to COVID  Throat:   Neck:   Supple, symmetrical, trachea midline, no adenopathy;    Thyroid: no enlargement/tenderness/nodules  Back:     Symmetric, no curvature, ROM normal, no CVA tenderness  Lungs:     Clear to auscultation bilaterally, respirations unlabored  Chest Wall:    No tenderness or deformity   Heart:    Regular rate and rhythm, S1 and S2 normal, no murmur, rub   or gallop  Breast Exam:    Deferred to GYN  Abdomen:     Soft, non-tender, bowel sounds active all four quadrants,    no masses, no organomegaly  Genitalia:    Deferred to GYN  Rectal:    Extremities:   Extremities normal, atraumatic.  L 5th toe- reddish purple, changes color during visit  Pulses:   2+ and symmetric all extremities  Skin:   Skin color, texture, turgor normal, no rashes or lesions  Lymph nodes:   Cervical, supraclavicular, and axillary nodes normal  Neurologic:   CNII-XII intact, normal strength, sensation and reflexes    throughout          Assessment & Plan:

## 2020-11-23 NOTE — Assessment & Plan Note (Signed)
Pt's PE WNL w/ exception of L 5th toe which is consistent w/ Chillblain's.  UTD on pap, mammo, colonoscopy, immunizations.  Check labs.  Anticipatory guidance provided.

## 2020-11-23 NOTE — Patient Instructions (Addendum)
Follow up in 1 year or as needed We'll notify you of your lab results and make any changes if needed Keep up the good work!  You look great! Make sure you are eating regularly We'll call you with your vascular appt about the toe Keep your feet warm to avoid vasospasms You can cancel Dr Susa Simmonds and Dermatology Call with any questions or concerns Stay Safe!  Stay Healthy! Happy New Year!!!

## 2020-11-23 NOTE — Assessment & Plan Note (Signed)
New.  Pt's toe isn't consistent w/ fx, infxn, gout- but looks like Chillblain's.  Reviewed dx w/ pt.  Will refer to vascular for additional work up and treatment.  Pt expressed understanding and is in agreement w/ plan.

## 2020-12-07 ENCOUNTER — Encounter: Payer: Self-pay | Admitting: Obstetrics & Gynecology

## 2020-12-07 ENCOUNTER — Other Ambulatory Visit (HOSPITAL_COMMUNITY)
Admission: RE | Admit: 2020-12-07 | Discharge: 2020-12-07 | Disposition: A | Discharge status: Home / Self Care | Payer: BC Managed Care – PPO | Source: Ambulatory Visit | Attending: Obstetrics & Gynecology | Admitting: Obstetrics & Gynecology

## 2020-12-07 ENCOUNTER — Other Ambulatory Visit: Payer: Self-pay

## 2020-12-07 ENCOUNTER — Ambulatory Visit (INDEPENDENT_AMBULATORY_CARE_PROVIDER_SITE_OTHER): Payer: BC Managed Care – PPO | Admitting: Obstetrics & Gynecology

## 2020-12-07 VITALS — BP 120/75 | HR 68 | Resp 16 | Ht 62.5 in | Wt 106.0 lb

## 2020-12-07 DIAGNOSIS — Z780 Asymptomatic menopausal state: Secondary | ICD-10-CM

## 2020-12-07 DIAGNOSIS — Z124 Encounter for screening for malignant neoplasm of cervix: Secondary | ICD-10-CM

## 2020-12-07 DIAGNOSIS — Z658 Other specified problems related to psychosocial circumstances: Secondary | ICD-10-CM

## 2020-12-07 DIAGNOSIS — M8580 Other specified disorders of bone density and structure, unspecified site: Secondary | ICD-10-CM

## 2020-12-07 DIAGNOSIS — Z79890 Hormone replacement therapy (postmenopausal): Secondary | ICD-10-CM | POA: Diagnosis not present

## 2020-12-07 DIAGNOSIS — Z01419 Encounter for gynecological examination (general) (routine) without abnormal findings: Secondary | ICD-10-CM | POA: Diagnosis not present

## 2020-12-07 DIAGNOSIS — L989 Disorder of the skin and subcutaneous tissue, unspecified: Secondary | ICD-10-CM

## 2020-12-07 NOTE — Progress Notes (Signed)
65 y.o. G0P0000 Married White or Caucasian female here for annual exam.  Doing well.  Mother lives with pt.  She is declining and she will be 89 this year.  Her memory has changed.  Had basal cell excision and had Mohs excision.  She also had an unusual change in her left 5th toe.  She saw Dr. Birdie Riddle.  Chillblain's was diagnosed.  She was referred to vascular.  Not sure she needs to go for this as toe symptoms have fully resolved.  Has some concerns about recent lipid results.  ACC/AHA cardiovascular risk calculator done with current lab work.  10 year risk for CVD is 3% for her right now.   Denies vaginal bleeding. Does want to went down on HRT dosage.    Has placed on buttocks she would like me to check.  Is in panty line and is sometimes irritated.  Doesn't have full trust in dermatologist who said to remove it.  Feels like she's just been "cut on" a lot this year.   Patient's last menstrual period was 10/23/2013.          The current method of family planning is post menopausal status.    Exercising: Yes.    Smoker:  no  Health Maintenance: Pap:  2018 neg with neg HR HPV History of abnormal Pap:  no MMG:  07/2020, had call back and this was normal.   Colonoscopy:  10/2019 BMD:   Will plan this year.  Last was 2017. TDaP:  07/20/2018 Pneumonia vaccine(s):  n/a Shingrix:   Completed Hep C testing: 5/18 negatve Screening Labs: did 09/2020 with Dr. Birdie Riddle   reports that she has never smoked. She has never used smokeless tobacco. She reports current alcohol use of about 1.0 standard drink of alcohol per week. She reports that she does not use drugs.  Past Medical History:  Diagnosis Date  . Basal cell carcinoma    face  . Breast nodule 04/1996   left  . Depression    reactive  . Iliotibial band syndrome of right side 09/2011  . Myalgia    back and hips   . Talipes cavus 02/2010    Past Surgical History:  Procedure Laterality Date  . DILATATION & CURRETTAGE/HYSTEROSCOPY WITH  RESECTOCOPE N/A 07/25/2015   Procedure: DILATATION & CURETTAGE/HYSTEROSCOPY;  Surgeon: Megan Salon, MD;  Location: [facility];  Service: Gynecology;  Laterality: N/A;    Current Outpatient Medications  Medication Sig Dispense Refill  . Calcium Carbonate-Vit D-Min (CALCIUM 1200 PO) Take by mouth daily.    . cyanocobalamin 100 MCG tablet Take 100 mcg by mouth daily.    Marland Kitchen estrogen, conjugated,-medroxyprogesterone (PREMPRO) 0.45-1.5 MG tablet Take 1 tablet by mouth daily. 90 tablet 4  . Multiple Vitamin (MULTIVITAMIN) tablet Take 1 tablet by mouth daily.    Marland Kitchen pyridOXINE (VITAMIN B-6) 100 MG tablet Take 100 mg by mouth daily.    . sertraline (ZOLOFT) 100 MG tablet Take 1 tablet (100 mg total) by mouth daily. (Patient taking differently: Take 50 mg by mouth daily.) 90 tablet 4   No current facility-administered medications for this visit.    Family History  Problem Relation Age of Onset  . Prostate cancer Father   . Dementia Father   . Osteopenia Mother   . Parkinson's disease Mother   . Breast cancer Paternal Grandmother   . Asthma Sister   . Early death Brother   . Coronary artery disease Maternal Grandmother   . Cancer Maternal Grandfather   .  Asthma Sister   . Colon cancer Neg Hx   . Esophageal cancer Neg Hx   . Rectal cancer Neg Hx   . Stomach cancer Neg Hx     Review of Systems  All other systems reviewed and are negative.   Exam:   BP 120/75   Pulse 68   Resp 16   Ht 5' 2.5" (1.588 m)   Wt 106 lb (48.1 kg)   LMP 10/23/2013   BMI 19.08 kg/m   Height: 5' 2.5" (158.8 cm)  General appearance: alert, cooperative and appears stated age Head: Normocephalic, without obvious abnormality, atraumatic Neck: no adenopathy, supple, symmetrical, trachea midline and thyroid normal to inspection and palpation Lungs: clear to auscultation bilaterally Breasts: normal appearance, no masses or tenderness Heart: regular rate and rhythm Abdomen: soft, non-tender; bowel sounds normal; no  masses,  no organomegaly Extremities: extremities normal, atraumatic, no cyanosis or edema Skin: Skin color, texture, turgor normal. No rashes or lesions Lymph nodes: Cervical, supraclavicular, and axillary nodes normal. No abnormal inguinal nodes palpated Neurologic: Grossly normal   Pelvic: External genitalia:  no lesions except for mildly pigmented lesion about 38mm on right buttocks that is smooth and without coloration changes              Urethra:  normal appearing urethra with no masses, tenderness or lesions              Bartholins and Skenes: normal                 Vagina: normal appearing vagina with normal color and discharge, no lesions              Cervix: no lesions              Pap taken: Yes.   Bimanual Exam:  Uterus:  normal size, contour, position, consistency, mobility, non-tender              Adnexa: normal adnexa and no mass, fullness, tenderness               Rectovaginal: Confirms               Anus:  normal sphincter tone, no lesions  Chaperone, Sherryle Lis, RN, was present for exam.  Assessment/Plan: 1. Well woman exam with routine gynecological exam - Pap smear with HR HPV obtained otday - MMG 07/2020 - BMD order placed - Colonoscopy 10/2019 - vaccines UTD - Labs done 09/2020 with Dr. Birdie Riddle  2. Hormone replacement therapy (HRT) - will decrease dosage to prem/pro 0.3/1.5mg  daily dosage in about two months when RFs needed.  Then next year will plan to decrease to every other day dosage and slowly wean off.  3. Osteopenia after menopause - DG BONE DENSITY (DXA); Future  4. Skin lesion - this lesion appears benign and I can remove if bothersome to her.  She will let me know if desires to have this done.  5. Psychosocial stressors - sertraline (ZOLOFT) 100 MG tablet; Take 1/2 tab (50mg ) every day.  Dispense: 45 tablet; Refill: 4

## 2020-12-08 LAB — CYTOLOGY - PAP
Adequacy: ABSENT
Comment: NEGATIVE
Diagnosis: NEGATIVE
High risk HPV: NEGATIVE

## 2020-12-08 MED ORDER — SERTRALINE HCL 100 MG PO TABS: ORAL_TABLET | ORAL | 4 refills | Status: DC

## 2020-12-14 ENCOUNTER — Ambulatory Visit: Payer: BC Managed Care – PPO | Admitting: Podiatry

## 2020-12-30 DIAGNOSIS — H524 Presbyopia: Secondary | ICD-10-CM | POA: Diagnosis not present

## 2020-12-30 DIAGNOSIS — H2513 Age-related nuclear cataract, bilateral: Secondary | ICD-10-CM | POA: Diagnosis not present

## 2020-12-30 DIAGNOSIS — H04123 Dry eye syndrome of bilateral lacrimal glands: Secondary | ICD-10-CM | POA: Diagnosis not present

## 2021-01-13 ENCOUNTER — Other Ambulatory Visit: Payer: Self-pay | Admitting: *Deleted

## 2021-01-13 ENCOUNTER — Ambulatory Visit: Payer: BLUE CROSS/BLUE SHIELD

## 2021-01-13 DIAGNOSIS — R238 Other skin changes: Secondary | ICD-10-CM

## 2021-01-24 ENCOUNTER — Encounter (HOSPITAL_BASED_OUTPATIENT_CLINIC_OR_DEPARTMENT_OTHER): Payer: Self-pay

## 2021-01-24 ENCOUNTER — Other Ambulatory Visit (HOSPITAL_BASED_OUTPATIENT_CLINIC_OR_DEPARTMENT_OTHER): Payer: Self-pay | Admitting: Obstetrics & Gynecology

## 2021-01-24 MED ORDER — PREMPRO 0.3-1.5 MG PO TABS: 1.0000 | ORAL_TABLET | Freq: Every day | ORAL | 4 refills | Status: DC

## 2021-01-25 DIAGNOSIS — H18522 Epithelial (juvenile) corneal dystrophy, left eye: Secondary | ICD-10-CM | POA: Insufficient documentation

## 2021-01-25 DIAGNOSIS — H04123 Dry eye syndrome of bilateral lacrimal glands: Secondary | ICD-10-CM | POA: Diagnosis not present

## 2021-01-25 DIAGNOSIS — H02882 Meibomian gland dysfunction right lower eyelid: Secondary | ICD-10-CM | POA: Diagnosis not present

## 2021-01-25 DIAGNOSIS — H0011 Chalazion right upper eyelid: Secondary | ICD-10-CM | POA: Diagnosis not present

## 2021-01-25 DIAGNOSIS — H02883 Meibomian gland dysfunction of right eye, unspecified eyelid: Secondary | ICD-10-CM | POA: Insufficient documentation

## 2021-01-25 DIAGNOSIS — H2513 Age-related nuclear cataract, bilateral: Secondary | ICD-10-CM | POA: Insufficient documentation

## 2021-01-31 ENCOUNTER — Ambulatory Visit: Payer: BC Managed Care – PPO | Admitting: Vascular Surgery

## 2021-01-31 ENCOUNTER — Other Ambulatory Visit: Payer: Self-pay

## 2021-01-31 ENCOUNTER — Ambulatory Visit (HOSPITAL_COMMUNITY)
Admission: RE | Admit: 2021-01-31 | Discharge: 2021-01-31 | Disposition: A | Discharge status: Home / Self Care | Payer: BC Managed Care – PPO | Source: Ambulatory Visit | Attending: Vascular Surgery | Admitting: Vascular Surgery

## 2021-01-31 ENCOUNTER — Encounter: Payer: Self-pay | Admitting: Vascular Surgery

## 2021-01-31 VITALS — BP 118/80 | HR 81 | Temp 97.9°F | Resp 20 | Ht 62.5 in | Wt 105.0 lb

## 2021-01-31 DIAGNOSIS — I7300 Raynaud's syndrome without gangrene: Secondary | ICD-10-CM | POA: Diagnosis not present

## 2021-01-31 DIAGNOSIS — R238 Other skin changes: Secondary | ICD-10-CM | POA: Diagnosis not present

## 2021-01-31 NOTE — Progress Notes (Signed)
[facility] [place]  ASSESSMENT / PLAN: 65 y.o. female with raynaud's phenomenon / chilblains lesion.  I had a long, detailed discussion about the benign nature of this phenomenon.  I counseled her to try warming therapies should this happen to her again.  It seems stress is an inciting factor.  I counseled her on mindfulness techniques and other strategies to deal with the stress.  She can follow-up with me as needed.  CHIEF COMPLAINT: Painful, red left fifth toe. Now resolved  HISTORY OF PRESENT ILLNESS: Laura Munoz is a 65 y.o. female referred to the clinic for evaluation of left fifth toe painful lesion.  She saw Dr. Birdie Riddle, her family medicine doctor, in the winter who diagnosed her with chilblains lesion.  Was referred to the clinic for further evaluation.  On my interview, the patient reports the winter was incredibly stressful time for her.  She is caring for her elderly mother who has Parkinson's requires help 24/7.  She is also been stressed with family arguments regarding vaccination for COVID-19.  She noted the painful lesion seem to be related to these events and the cold weather.  She is never had a episode like this before.  She is very healthy woman.  She exercises 6 times a week.  She is a non-smoker.  Past Medical History:  Diagnosis Date  . Basal cell carcinoma    face  . Breast nodule 04/1996   left  . Depression    reactive  . Iliotibial band syndrome of right side 09/2011  . Myalgia    back and hips   . Talipes cavus 02/2010    Past Surgical History:  Procedure Laterality Date  . DILATATION & CURRETTAGE/HYSTEROSCOPY WITH RESECTOCOPE N/A 07/25/2015   Procedure: DILATATION & CURETTAGE/HYSTEROSCOPY;  Surgeon: Megan Salon, MD;  Location: [facility];  Service: Gynecology;  Laterality: N/A;  . MOHS SURGERY  2021    Family History  Problem Relation Age of Onset  . Prostate cancer Father   . Dementia Father   . Osteopenia Mother   .  Parkinson's disease Mother   . Breast cancer Paternal Grandmother   . Asthma Sister   . Early death Brother   . Coronary artery disease Maternal Grandmother   . Cancer Maternal Grandfather   . Asthma Sister   . Colon cancer Neg Hx   . Esophageal cancer Neg Hx   . Rectal cancer Neg Hx   . Stomach cancer Neg Hx     Social History   Socioeconomic History  . Marital status: Married    Spouse name: Not on file  . Number of children: Not on file  . Years of education: Not on file  . Highest education level: Not on file  Occupational History  . Not on file  Tobacco Use  . Smoking status: Never Smoker  . Smokeless tobacco: Never Used  . 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 Vaping Use: Never used  Substance and Sexual Activity  . Alcohol use: Yes    Alcohol/week: 1.0 standard drink    Types: 1 Standard drinks or equivalent per week    Comment: socailly  . Drug use: No  . Sexual activity: Not Currently    Partners: Male    Birth control/protection: Post-menopausal  Other Topics Concern  . Not on file  Social History Narrative  . Not on file   Social Determinants of Health   Financial Resource  Strain: Not on file  Food Insecurity: Not on file  Transportation Needs: Not on file  Physical Activity: Not on file  Stress: Not on file  Social Connections: Not on file  Intimate Partner Violence: Not on file    Allergies  Allergen Reactions  . Chocolate   . Cocoa Other (See Comments)  . Codeine Other (See Comments)    Passes out  . Doxycycline Rash    Current Outpatient Medications  Medication Sig Dispense Refill  . Calcium Carbonate-Vit D-Min (CALCIUM 1200 PO) Take by mouth daily.    . cyanocobalamin 100 MCG tablet Take 100 mcg by mouth daily.    Marland Kitchen estrogen, conjugated,-medroxyprogesterone (PREMPRO) 0.3-1.5 MG tablet Take 1 tablet by mouth daily. 90 tablet 4  . Multiple Vitamin (MULTIVITAMIN) tablet Take 1 tablet by  mouth daily.    Marland Kitchen pyridOXINE (VITAMIN B-6) 100 MG tablet Take 100 mg by mouth daily.    . sertraline (ZOLOFT) 100 MG tablet Take 1/2 tab (50mg ) every day. 45 tablet 4   No current facility-administered medications for this visit.    REVIEW OF SYSTEMS:  [X]  denotes positive finding, [ ]  denotes negative finding Cardiac  Comments:  Chest pain or chest pressure:    Shortness of breath upon exertion:    Short of breath when lying flat:    Irregular heart rhythm:        Vascular    Pain in calf, thigh, or hip brought on by ambulation:    Pain in feet at night that wakes you up from your sleep:     Blood clot in your veins:    Leg swelling:         Pulmonary    Oxygen at home:    Productive cough:     Wheezing:         Neurologic    Sudden weakness in arms or legs:     Sudden numbness in arms or legs:     Sudden onset of difficulty speaking or slurred speech:    Temporary loss of vision in one eye:     Problems with dizziness:         Gastrointestinal    Blood in stool:     Vomited blood:         Genitourinary    Burning when urinating:     Blood in urine:        Psychiatric    Major depression:         Hematologic    Bleeding problems:    Problems with blood clotting too easily:        Skin    Rashes or ulcers:        Constitutional    Fever or chills:      PHYSICAL EXAM  Vitals:   01/31/21 1340  BP: 118/80  Pulse: 81  Resp: 20  Temp: 97.9 F (36.6 C)  SpO2: 100%  Weight: 105 lb (47.6 kg)  Height: 5' 2.5" (1.588 m)    Constitutional: well appearing. no distress. Appears well nourished.  Neurologic: CN intact. no focal findings. no sensory loss. Psychiatric: Mood and affect symmetric and appropriate. Eyes: No icterus. No conjunctival pallor. Ears, nose, throat: mucous membranes moist. Midline trachea.  Cardiac: regular rate and rhythm.  Respiratory: unlabored. Abdominal: soft, non-tender, non-distended.  Peripheral vascular:  2+ radial pulse  bilaterally  2+ PT bilaterally Extremity: No edema. No cyanosis. No pallor.  Skin: No gangrene. No ulceration.  Lymphatic: No Stemmer's sign.  No palpable lymphadenopathy.  PERTINENT LABORATORY AND RADIOLOGIC DATA  Most recent CBC CBC Latest Ref Rng & Units 11/23/2020 07/27/2019 02/06/2018  WBC 4.0 - 10.5 K/uL 3.8(L) 3.4(L) 5.8  Hemoglobin 12.0 - 15.0 g/dL 13.1 12.9 13.2  Hematocrit 36.0 - 46.0 % 38.9 38.9 39.3  Platelets 150.0 - 400.0 K/uL 284.0 272.0 281.0     Most recent CMP CMP Latest Ref Rng & Units 11/23/2020 07/27/2019 02/06/2018  Glucose 70 - 99 mg/dL 104(H) 76 88  BUN 6 - 23 mg/dL 21 20 24(H)  Creatinine 0.40 - 1.20 mg/dL 1.06 0.86 0.86  Sodium 135 - 145 mEq/L 137 137 137  Potassium 3.5 - 5.1 mEq/L 4.4 4.6 4.7  Chloride 96 - 112 mEq/L 101 101 102  CO2 19 - 32 mEq/L 31 31 32  Calcium 8.4 - 10.5 mg/dL 9.5 9.5 9.8  Total Protein 6.0 - 8.3 g/dL 7.4 7.0 7.1  Total Bilirubin 0.2 - 1.2 mg/dL 0.4 0.5 0.3  Alkaline Phos 39 - 117 U/L 33(L) 28(L) 28(L)  AST 0 - 37 U/L 27 21 18   ALT 0 - 35 U/L 21 18 18     Hgb A1c MFr Bld (%)  Date Value  06/24/2018 5.4    LDL Cholesterol  Date Value Ref Range Status  11/23/2020 104 (H) 0 - 99 mg/dL Final    Yevonne Aline. Stanford Breed, MD [facility] Phone Number: [phone] 01/31/2021 10:16 AM

## 2021-03-02 ENCOUNTER — Ambulatory Visit: Payer: BLUE CROSS/BLUE SHIELD | Admitting: Obstetrics and Gynecology

## 2021-04-21 DIAGNOSIS — H0011 Chalazion right upper eyelid: Secondary | ICD-10-CM | POA: Diagnosis not present

## 2021-04-21 DIAGNOSIS — H25813 Combined forms of age-related cataract, bilateral: Secondary | ICD-10-CM | POA: Diagnosis not present

## 2021-05-12 DIAGNOSIS — D1801 Hemangioma of skin and subcutaneous tissue: Secondary | ICD-10-CM | POA: Diagnosis not present

## 2021-05-12 DIAGNOSIS — L814 Other melanin hyperpigmentation: Secondary | ICD-10-CM | POA: Diagnosis not present

## 2021-05-12 DIAGNOSIS — L821 Other seborrheic keratosis: Secondary | ICD-10-CM | POA: Diagnosis not present

## 2021-05-12 DIAGNOSIS — L905 Scar conditions and fibrosis of skin: Secondary | ICD-10-CM | POA: Diagnosis not present

## 2021-05-24 ENCOUNTER — Encounter: Payer: Self-pay | Admitting: *Deleted

## 2021-05-30 ENCOUNTER — Other Ambulatory Visit: Payer: Self-pay | Admitting: Obstetrics & Gynecology

## 2021-05-30 DIAGNOSIS — Z1231 Encounter for screening mammogram for malignant neoplasm of breast: Secondary | ICD-10-CM

## 2021-06-23 IMAGING — MG MM DIGITAL DIAGNOSTIC UNILAT*L* W/ TOMO W/ CAD
4 series · 4 of 12 positions shown · non-contrast
Comparison: 08/17/2020 and multiple prior studies including 3445

CLINICAL DATA: Patient returns after screening study for evaluation
possible LEFT breast mass.

EXAM:
DIGITAL DIAGNOSTIC LEFT MAMMOGRAM WITH CAD AND TOMO
ULTRASOUND LEFT BREAST

[L MLO synth-2D]
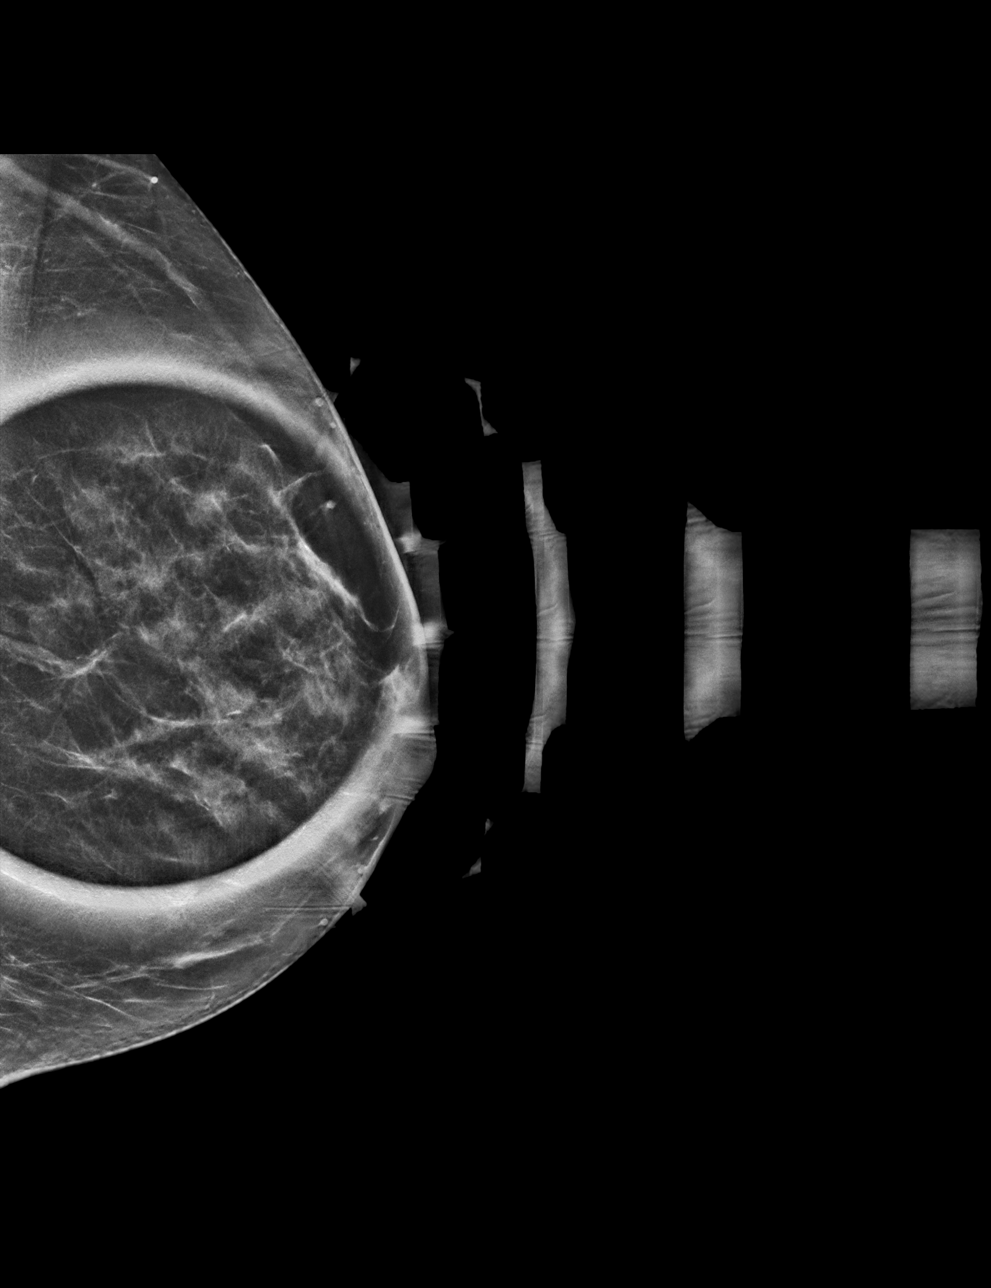

[L CC synth-2D]
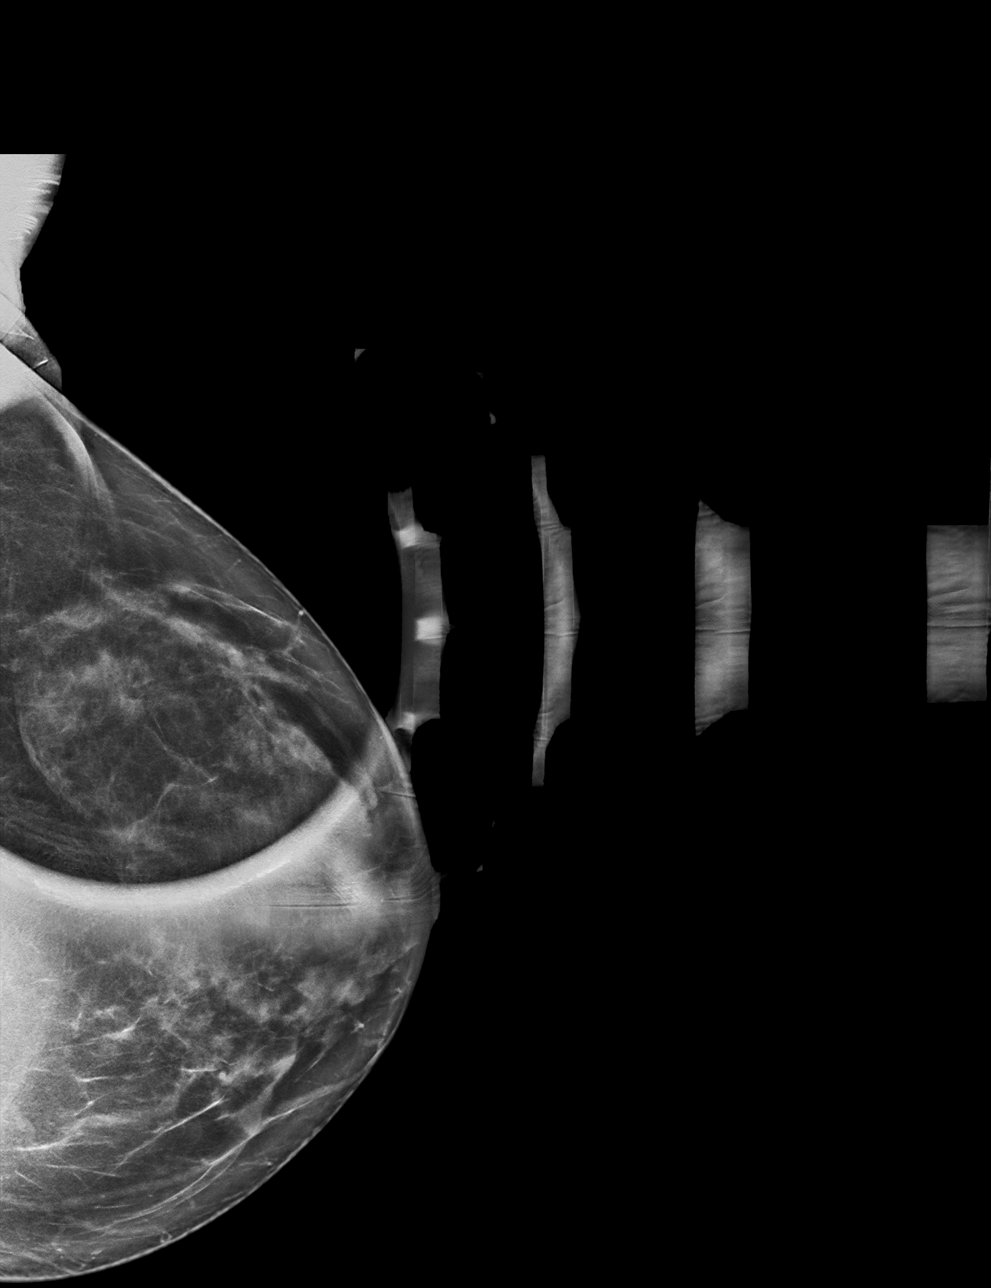

[L MLO tomo · tomo slice 28/55.0]
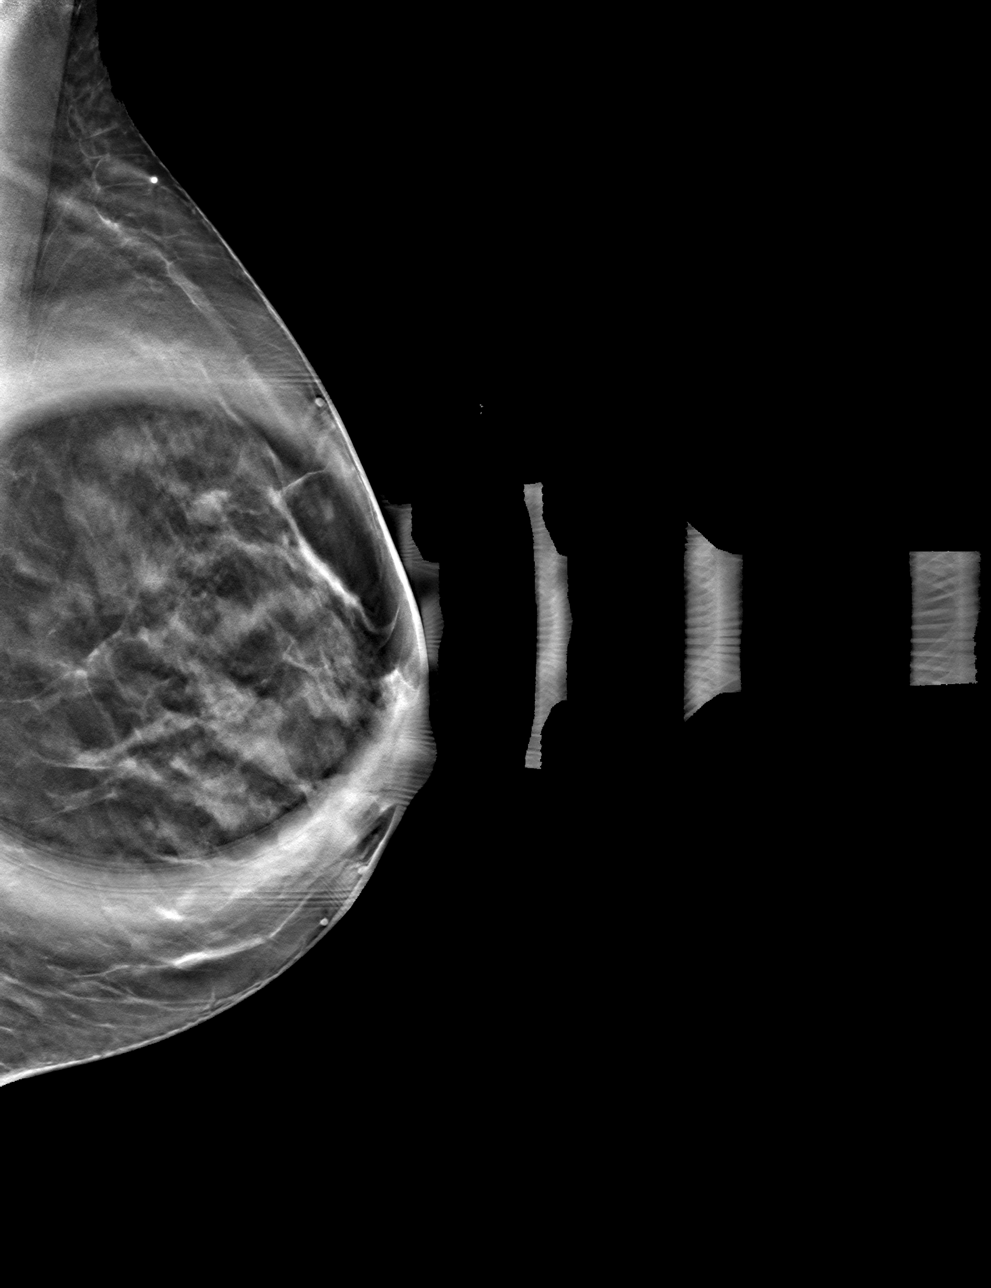

[L CC tomo · tomo slice 33/64.0]
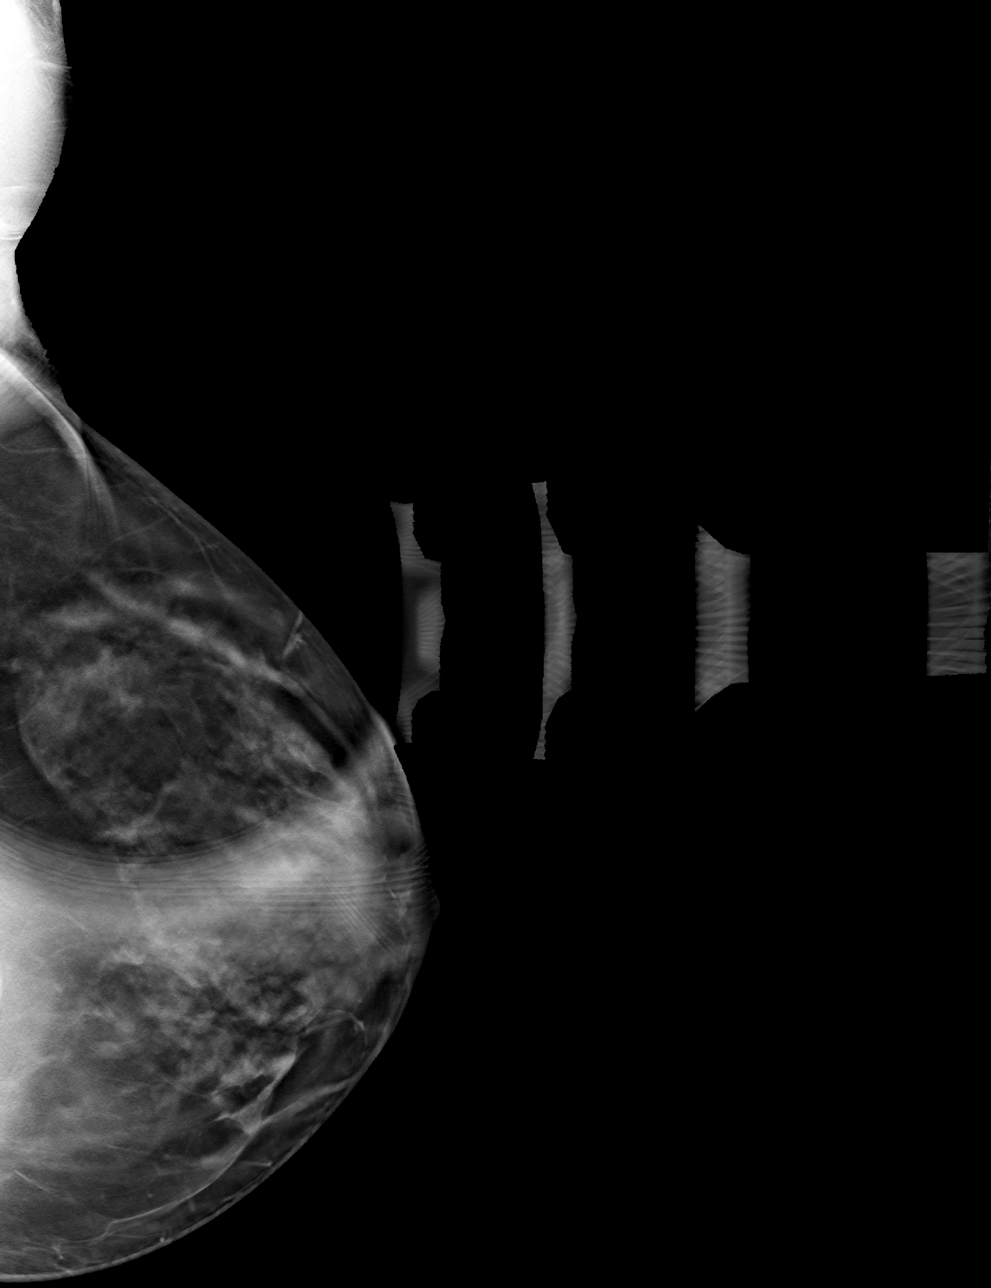

[4 of 12 positions shown; findings below may reference images not displayed]

ACR Breast Density Category c: The breast tissue is heterogeneously
dense, which may obscure small masses.
FINDINGS: Additional 2-D and 3-D images are performed. These views show no
persistent mass in the UPPER-OUTER QUADRANT of the LEFT breast.

Mammographic images were processed with CAD.

Targeted ultrasound is performed, showing dense fibroglandular
tissue and scattered fibrocystic changes in the UPPER-OUTER QUADRANT
of the LEFT breast. No suspicious mass, distortion, or acoustic
shadowing is demonstrated with ultrasound.
IMPRESSION: No mammographic or ultrasound evidence for malignancy.

RECOMMENDATION:
Screening mammogram in one year.(Code:GC-G-PXB)

I have discussed the findings and recommendations with the patient.
If applicable, a reminder letter will be sent to the patient
regarding the next appointment.

BI-RADS CATEGORY  1: Negative.

## 2021-06-27 DIAGNOSIS — Z20822 Contact with and (suspected) exposure to covid-19: Secondary | ICD-10-CM | POA: Diagnosis not present

## 2021-07-20 DIAGNOSIS — L821 Other seborrheic keratosis: Secondary | ICD-10-CM | POA: Diagnosis not present

## 2021-10-24 ENCOUNTER — Ambulatory Visit: Payer: BC Managed Care – PPO | Admitting: Sports Medicine

## 2021-10-24 ENCOUNTER — Ambulatory Visit: Payer: Self-pay

## 2021-10-24 VITALS — BP 120/82 | Ht 63.0 in | Wt 105.0 lb

## 2021-10-24 DIAGNOSIS — M25511 Pain in right shoulder: Secondary | ICD-10-CM | POA: Diagnosis not present

## 2021-10-24 DIAGNOSIS — S46219A Strain of muscle, fascia and tendon of other parts of biceps, unspecified arm, initial encounter: Secondary | ICD-10-CM

## 2021-10-24 MED ORDER — NITROGLYCERIN 0.2 MG/HR TD PT24: MEDICATED_PATCH | TRANSDERMAL | 1 refills | Status: DC

## 2021-10-24 NOTE — Assessment & Plan Note (Signed)
HEP Limit weights NTG protocol Reck 6 wks

## 2021-10-24 NOTE — Progress Notes (Signed)
Maurina Fawaz - 65 y.o. female MRN [id number]  Date of birth: [birth year]  SUBJECTIVE:   CC: R shoulder pain   Ms. Preeti Winegardner is a 65 year old Caucasian female with past medical history of lateral epicondylitis, iliotibial band syndrome, plantar fasciitis of left foot, degenerative tear of medial meniscus of left knee, chilblain lesions with Raynaud's, who presents for evaluation of right shoulder pain.  Patient reports 2-week history of right shoulder pain.  There was no inciting event, the pain came on gradually and has progressively worsened.  Pain is a dull ache in her anterior shoulder that becomes more painful with exercise and lying on the affected side.  Patient exercises multiple times a week participating in [place] dance classes and light weightlifting.  Because of her shoulder pain she has gone from using 7 pound weights 6 times a week to using no weight for upper body exercises, she has limited exercises which cause worsening pain.  She endorses no limitations with range of motion.  She does have a pulling/clicking sensation when she reaches for items.  She intermittently takes NSAIDs, in the last week she has taken Aleve every day without improvement in pain.  She endorses pain that radiates to her elbow however she also recently injured her elbow by bumping it into the dryer door and associates this is a separate problem from the shoulder.  Denies pain radiating to her fingertips, numbness, tingling.  ROS: Negative aside from above    PHYSICAL EXAM:  VS: BP:120/82  HR: bpm  TEMP: ( )  RESP:   HT:5\' 3"  (160 cm)   WT:105 lb (47.6 kg)  BMI:18.6 PHYSICAL EXAM: Gen: NAD, alert, cooperative with exam, well-appearing HEENT: clear conjunctiva, nontraumatic CV:  no peripheral edema, extremities well perfused Resp: non-labored breathing on room air Skin: no rashes, normal turgor  Neuro: no gross deficits  Psych:  alert and oriented MSK: Bilateral scapular winging.   Limited shoulder extension. And elevation on right otherwise full ROM of right shoulder.  Preserved strength 5/5 RUE.  Pain with R internal rotation and R abduction.  Pain to palpation anterior shoulder/proximal humerus. Positive empty can, Michel Bickers, Speed's.  Negative Yergason's.  No pain with resisted wrist and finger flexor extension. Note slight protraction of both shoulder while sitting  Painful arc at 120 deg abduction  Ultrasound right shoulder:  Hypoechoic change in mid tendon on SAX and hypoechoic swelling on both sides of tendon on LAX suggesting a partial tear biceps tendon.   Rotator cuff muscles intact.   Mild subacromial bursitis.with fluid noted by hypoechoic change AC joint was normal.  Impression:  Ultrasound consistent with partical biceps tendon tear and mild bursitis  Ultrasound and interpretation by Wolfgang Phoenix. Fields, MD   ASSESSMENT & PLAN:   Ms. Travonna Swindle is a 65 year old Caucasian female with past medical history of lateral epicondylitis, who presents for evaluation of right shoulder pain.  Ultrasound imaging showed partial tear biceps tendon of the right shoulder.  She also had some subacromial bursitis.  Biceps tendinopathy possibly secondary to shoulder instability due to muscle imbalance as evidenced by scapular winging. Plan: -Scapular series HEP lightweight -Rotator cuff HEP with stretching lightweight -Bicep HEP no weight -Patient can continue to attend dance classes, limiting exercises that exacerbate pain, no overhead exercises -Continue conservative management with Aleve -Nitroglycerin patches -Follow-up in 6 weeks for re-evaluation  Portions of this report may have been transcribed using voice recognition software. Every effort was made to ensure accuracy; however,  inadvertent computerized transcription errors may be present.   Wayland Denis, MD 10/24/21,  1:16 PM Pager: [phone] Internal Medicine Resident, PGY-1 [facility]  Internal Medicine    I observed and examined the patient with the resident and agree with assessment and plan.  Note reviewed and modified by me. Ila Mcgill, MD

## 2021-10-24 NOTE — Patient Instructions (Signed)

## 2021-11-10 ENCOUNTER — Ambulatory Visit
Admission: RE | Admit: 2021-11-10 | Discharge: 2021-11-10 | Disposition: A | Discharge status: Home / Self Care | Payer: BC Managed Care – PPO | Source: Ambulatory Visit | Attending: Obstetrics & Gynecology | Admitting: Obstetrics & Gynecology

## 2021-11-10 ENCOUNTER — Other Ambulatory Visit: Payer: Self-pay

## 2021-11-10 DIAGNOSIS — Z780 Asymptomatic menopausal state: Secondary | ICD-10-CM | POA: Diagnosis not present

## 2021-11-10 DIAGNOSIS — Z1231 Encounter for screening mammogram for malignant neoplasm of breast: Secondary | ICD-10-CM | POA: Diagnosis not present

## 2021-11-10 DIAGNOSIS — M8589 Other specified disorders of bone density and structure, multiple sites: Secondary | ICD-10-CM | POA: Diagnosis not present

## 2021-11-10 DIAGNOSIS — M8580 Other specified disorders of bone density and structure, unspecified site: Secondary | ICD-10-CM

## 2021-11-14 ENCOUNTER — Encounter (HOSPITAL_BASED_OUTPATIENT_CLINIC_OR_DEPARTMENT_OTHER): Payer: Self-pay | Admitting: Obstetrics & Gynecology

## 2021-12-05 ENCOUNTER — Ambulatory Visit: Payer: BC Managed Care – PPO | Admitting: Sports Medicine

## 2021-12-05 ENCOUNTER — Ambulatory Visit: Payer: Self-pay

## 2021-12-05 VITALS — BP 128/88 | Ht 62.5 in | Wt 105.0 lb

## 2021-12-05 DIAGNOSIS — M7501 Adhesive capsulitis of right shoulder: Secondary | ICD-10-CM

## 2021-12-05 DIAGNOSIS — S46219A Strain of muscle, fascia and tendon of other parts of biceps, unspecified arm, initial encounter: Secondary | ICD-10-CM | POA: Diagnosis not present

## 2021-12-05 DIAGNOSIS — M25511 Pain in right shoulder: Secondary | ICD-10-CM | POA: Diagnosis not present

## 2021-12-05 DIAGNOSIS — M75101 Unspecified rotator cuff tear or rupture of right shoulder, not specified as traumatic: Secondary | ICD-10-CM | POA: Insufficient documentation

## 2021-12-05 DIAGNOSIS — M7500 Adhesive capsulitis of unspecified shoulder: Secondary | ICD-10-CM | POA: Insufficient documentation

## 2021-12-05 MED ORDER — AMITRIPTYLINE HCL 25 MG PO TABS: 25.0000 mg | ORAL_TABLET | Freq: Every day | ORAL | 1 refills | Status: DC

## 2021-12-05 NOTE — Assessment & Plan Note (Signed)
This seems acute on chronic strain based on HX  We will cont NTG patches but key issue is that she also developed a frozen shoulder following this acute event  Stop strength exercise and begin Codman exercises

## 2021-12-05 NOTE — Assessment & Plan Note (Signed)
Unfortunate complication of recent injury  Start Codman exercises No strength work - she needs to back off her exercise program and not stress RT shoulder and arm  Trial on amitriptyline 25 at HS for 1 month Reck at the end of this month and see if any motion improvement

## 2021-12-05 NOTE — Patient Instructions (Signed)
It was great to see you today!  -I'm sorry your shoulder is worse. -You've developed frozen shoulder (adhesive capsulitis) so we are giving you range of motion exercises to help get your motion back.  -Please do not push through pain greater than a 3/10. -Start amitriptyline 25 mg at bedtime. -Follow up in 1 month but call sooner with questions.

## 2021-12-05 NOTE — Assessment & Plan Note (Signed)
This is unchanged from 10/24/21 Will modify HEP Keep up NTG patches

## 2021-12-05 NOTE — Progress Notes (Signed)
CC: worse RT Shoulder pain  Patient seen 11/29 Partial biceps tear but RC was OK Given HEP and NTG However, exercises seemed to cause too much pain  After holding arm in position for mammogram she had some fatigue in arm Came home and trying to open  bag by pulling hard in ER She experienced sharp pain  Since then some night pain However, worse symptom is that she cannot move her arm very well Daytime pain is mild  ROS No neck pain No numbness or tingling  PE Patient seems worried but NAD BP 128/88    Ht 5' 2.5" (1.588 m)    Wt 105 lb (47.6 kg)    LMP 10/23/2013    BMI 18.90 kg/m   Limited forward flexion to 120 deg ABduction and elevation 90 deg ER at 20 deg abduction is 0 versus 30 deg on left  Strength is preserved No pain on speed or yergason test Pain on empty can  Ultrasound Right shoulder Partial tear of proximal biceps tendon unchanged with hypoechoic change New hypoechoic change at foot plate insertion of supraspinatus tendon with 34mm retraction Infraspinatus, subscapularis and TM are all normal  Impression: new distal supraspinatus tear and biceps tendon proximal tear  Ultrasound and interpretation by Wolfgang Phoenix. Oneida Alar, MD

## 2021-12-08 ENCOUNTER — Ambulatory Visit (INDEPENDENT_AMBULATORY_CARE_PROVIDER_SITE_OTHER): Payer: BC Managed Care – PPO | Admitting: Obstetrics & Gynecology

## 2021-12-08 ENCOUNTER — Other Ambulatory Visit: Payer: Self-pay

## 2021-12-08 ENCOUNTER — Encounter (HOSPITAL_BASED_OUTPATIENT_CLINIC_OR_DEPARTMENT_OTHER): Payer: Self-pay | Admitting: Obstetrics & Gynecology

## 2021-12-08 VITALS — BP 132/78 | HR 70 | Ht 62.5 in | Wt 104.8 lb

## 2021-12-08 DIAGNOSIS — Z01419 Encounter for gynecological examination (general) (routine) without abnormal findings: Secondary | ICD-10-CM

## 2021-12-08 DIAGNOSIS — M8580 Other specified disorders of bone density and structure, unspecified site: Secondary | ICD-10-CM

## 2021-12-08 DIAGNOSIS — Z79890 Hormone replacement therapy (postmenopausal): Secondary | ICD-10-CM | POA: Diagnosis not present

## 2021-12-08 DIAGNOSIS — Z658 Other specified problems related to psychosocial circumstances: Secondary | ICD-10-CM

## 2021-12-08 DIAGNOSIS — Z780 Asymptomatic menopausal state: Secondary | ICD-10-CM

## 2021-12-08 MED ORDER — PREMPRO 0.3-1.5 MG PO TABS: 1.0000 | ORAL_TABLET | Freq: Every day | ORAL | 4 refills | Status: DC

## 2021-12-08 MED ORDER — SERTRALINE HCL 100 MG PO TABS: ORAL_TABLET | ORAL | 4 refills | Status: DC

## 2021-12-08 NOTE — Progress Notes (Signed)
66 y.o. [place] Married White or Caucasian female here for annual exam.  Having some increased issues with anxiety especially when there is significant weather systems.  Her mother lives with them and she is 95.  Takes Zoloft every other day.  We discussed increasing this to daily.    Reports she's having some right arm pain and likely tore her right biceps tendons.  Took 4 weeks to make an appointment.  Had another tear in the front of the rotator cuff.  She is on amitriptyline  Patient's last menstrual period was 10/23/2013.          Sexually active: not much The current method of family planning is post menopausal status.    Exercising: Yes.     Smoker:  no  Health Maintenance: Pap:  12/07/2020 Negative History of abnormal Pap:  no MMG:  11/10/2021 Negative Colonoscopy:  11/17/2019 BMD:   11/10/2021 Osteopenia, -1.2 Screening Labs: 10/2021   reports that she has never smoked. She has never used smokeless tobacco. She reports current alcohol use of about 1.0 standard drink per week. She reports that she does not use drugs.  Past Medical History:  Diagnosis Date   Basal cell carcinoma    face   Breast nodule 04/1996   left   Depression    reactive   Iliotibial band syndrome of right side 09/2011   Myalgia    back and hips    Talipes cavus 02/2010    Past Surgical History:  Procedure Laterality Date   DILATATION & CURRETTAGE/HYSTEROSCOPY WITH RESECTOCOPE N/A 07/25/2015   Procedure: DILATATION & CURETTAGE/HYSTEROSCOPY;  Surgeon: Megan Salon, MD;  Location: [facility];  Service: Gynecology;  Laterality: N/A;   MOHS SURGERY  2021    Current Outpatient Medications  Medication Sig Dispense Refill   Calcium Carbonate-Vit D-Min (CALCIUM 1200 PO) Take by mouth daily.     cyanocobalamin 100 MCG tablet Take 100 mcg by mouth daily.     Multiple Vitamin (MULTIVITAMIN) tablet Take 1 tablet by mouth daily.     nitroGLYCERIN (NITRODUR - DOSED IN MG/24 HR) 0.2 mg/hr patch Use 1/4 patch daily  to the affected area 30 patch 1   pyridOXINE (VITAMIN B-6) 100 MG tablet Take 100 mg by mouth daily.     amitriptyline (ELAVIL) 25 MG tablet Take 1 tablet (25 mg total) by mouth at bedtime. 30 tablet 1   estrogen, conjugated,-medroxyprogesterone (PREMPRO) 0.3-1.5 MG tablet Take 1 tablet by mouth daily. 90 tablet 4   sertraline (ZOLOFT) 100 MG tablet Take 1/2 tab (50mg ) every day. 45 tablet 4   No current facility-administered medications for this visit.    Family History  Problem Relation Age of Onset   Prostate cancer Father    Dementia Father    Osteopenia Mother    Parkinson's disease Mother    Breast cancer Paternal Grandmother    Asthma Sister    Early death Brother    Coronary artery disease Maternal Grandmother    Cancer Maternal Grandfather    Asthma Sister    Colon cancer Neg Hx    Esophageal cancer Neg Hx    Rectal cancer Neg Hx    Stomach cancer Neg Hx     Review of Systems  Psychiatric/Behavioral:  The patient is nervous/anxious.   All other systems reviewed and are negative.  Exam:   BP 132/78 (BP Location: Left Arm, Patient Position: Sitting, Cuff Size: Normal)    Pulse 70    Ht 5' 2.5" (1.588  m)    Wt 104 lb 12.8 oz (47.5 kg)    LMP 10/23/2013    BMI 18.86 kg/m   Height: 5' 2.5" (158.8 cm)  General appearance: alert, cooperative and appears stated age Head: Normocephalic, without obvious abnormality, atraumatic Neck: no adenopathy, supple, symmetrical, trachea midline and thyroid normal to inspection and palpation Lungs: clear to auscultation bilaterally Breasts: normal appearance, no masses or tenderness Heart: regular rate and rhythm Abdomen: soft, non-tender; bowel sounds normal; no masses,  no organomegaly Extremities: extremities normal, atraumatic, no cyanosis or edema Skin: Skin color, texture, turgor normal. No rashes or lesions Lymph nodes: Cervical, supraclavicular, and axillary nodes normal. No abnormal inguinal nodes palpated Neurologic:  Grossly normal   Pelvic: External genitalia:  no lesions              Urethra:  normal appearing urethra with no masses, tenderness or lesions              Bartholins and Skenes: normal                 Vagina: normal appearing vagina with normal color and no discharge, no lesions              Cervix: no lesions              Pap taken: No. Bimanual Exam:  Uterus:  normal size, contour, position, consistency, mobility, non-tender              Adnexa: normal adnexa and no mass, fullness, tenderness               Rectovaginal: Confirms               Anus:  normal sphincter tone, no lesions  Chaperone, Octaviano Batty, CMA, was present for exam.  Assessment/Plan: 1. Well woman exam with routine gynecological exam - pap neg with neg HR HPV 12/07/2020 - MMG 10/2021 - colonoscopy 10/2019 - BMD 10/2021 with mild osteopenia - lab work done 10/2021 - vaccines reviewed/updated  2. Hormone replacement therapy (HRT) - risks and benefits reviewed.  No changes made at this time. - estrogen, conjugated,-medroxyprogesterone (PREMPRO) 0.3-1.5 MG tablet; Take 1 tablet by mouth daily.  Dispense: 90 tablet; Refill: 4  3. Psychosocial stressors - will increase sertraline using to 1/2 tab every day.  Pt understands this is low dosed and dosage can be increased if needed. - sertraline (ZOLOFT) 100 MG tablet; Take 1/2 tab (50mg ) every day.  Dispense: 45 tablet; Refill: 4  4. Osteopenia after menopause

## 2022-01-04 DIAGNOSIS — H04123 Dry eye syndrome of bilateral lacrimal glands: Secondary | ICD-10-CM | POA: Diagnosis not present

## 2022-01-04 DIAGNOSIS — H0288A Meibomian gland dysfunction right eye, upper and lower eyelids: Secondary | ICD-10-CM | POA: Diagnosis not present

## 2022-01-04 DIAGNOSIS — H0288B Meibomian gland dysfunction left eye, upper and lower eyelids: Secondary | ICD-10-CM | POA: Diagnosis not present

## 2022-01-04 DIAGNOSIS — H18522 Epithelial (juvenile) corneal dystrophy, left eye: Secondary | ICD-10-CM | POA: Diagnosis not present

## 2022-01-09 ENCOUNTER — Ambulatory Visit: Payer: BC Managed Care – PPO | Admitting: Sports Medicine

## 2022-01-09 DIAGNOSIS — S76311A Strain of muscle, fascia and tendon of the posterior muscle group at thigh level, right thigh, initial encounter: Secondary | ICD-10-CM | POA: Diagnosis not present

## 2022-01-09 DIAGNOSIS — M7501 Adhesive capsulitis of right shoulder: Secondary | ICD-10-CM | POA: Diagnosis not present

## 2022-01-09 NOTE — Progress Notes (Signed)
Chief complaint follow-up of frozen shoulder  Patient was seen on January 10 after a partial biceps tear that was noted in November. She had reinjured her right shoulder and actually had a partial supraspinatus tear. She did this reaching back to get something out of the backseat. The biggest issue on the January visit was that she was losing range of motion and developing a frozen shoulder At that time she had no external rotation at 20 degrees of abduction and had limited forward flexion and elevation.  We gave her Codman exercises which she has done faithfully every day She feels her strength and range of motion have improved some She has very little pain  A key factor in her injuries is that she has been taking care of her mother at home for approximately 5 years. She often had to get up at night and probably had some ongoing fatigue. She recently hired someone to work night shifts and help her out.  Review of systems reveals some tightness at the ischial tuberosity and pulling in her right hamstrings.  This came from bending over and getting something out of the oven.  She does regular Jazzercise and exercises daily  Physical exam Pleasant thin female in no acute distress BP 128/80    Ht 5' 2.5" (1.588 m)    Wt 105 lb (47.6 kg)    LMP 10/23/2013    BMI 18.90 kg/m  [place] Adult Exercise 10/24/2021  Frequency of aerobic exercise (# of days/week) 6  Average time in minutes 30  Frequency of strengthening activities (# of days/week) 6   Today her range of motion is still limited on the right shoulder There is improvement of external rotation by about 15 degrees There is improvement of forward flexion to about 120 degrees Abduction remains at about 90 degrees Internal rotation shows back scratch to L2  Strength testing is good and she gets only mild pain with speeds and Yergason's Test  Hamstring testing showed tenderness at the ischial tuberosity insertion of the  right hamstrings Weakness of the medial hamstrings and testing but good strength of the biceps femoris Flexibility is excellent Testing of the left leg reveals excellent strength and no tenderness

## 2022-01-09 NOTE — Assessment & Plan Note (Signed)
We will give her ASkling exercises to do 4 times a week I do feel like her stress is probably contributing and leaving her some excessive muscle fatigue Modify her exercises as to not overly stretch the hamstring Recheck on her return in 6 weeks and consider ultrasound if needed

## 2022-01-09 NOTE — Assessment & Plan Note (Signed)
She has made good progress by doing her exercises daily with an improvement in external rotation and some improvement and elevation We will continue these exercises on a daily basis I did suggest adding a band to do some additional passive stretching Recheck in 6 weeks and repeat ultrasound  Take her amitriptyline 25 at night which she was afraid to do

## 2022-02-20 ENCOUNTER — Ambulatory Visit: Payer: BC Managed Care – PPO | Admitting: Sports Medicine

## 2022-02-20 ENCOUNTER — Ambulatory Visit: Payer: Self-pay

## 2022-02-20 VITALS — BP 118/72 | Ht 62.5 in | Wt 105.0 lb

## 2022-02-20 DIAGNOSIS — S76311A Strain of muscle, fascia and tendon of the posterior muscle group at thigh level, right thigh, initial encounter: Secondary | ICD-10-CM | POA: Diagnosis not present

## 2022-02-20 DIAGNOSIS — M7501 Adhesive capsulitis of right shoulder: Secondary | ICD-10-CM

## 2022-02-20 MED ORDER — AMITRIPTYLINE HCL 25 MG PO TABS: 25.0000 mg | ORAL_TABLET | Freq: Every day | ORAL | 3 refills | Status: DC

## 2022-02-20 NOTE — Assessment & Plan Note (Signed)
There is some interval improvement although slow ?Still doing HEP very faithfully ?May need formal PT but has challenges with taking care of her mother ? ?Cont Codman exercises ?Cont. amitriptyline ?

## 2022-02-20 NOTE — Progress Notes (Signed)
PCP: Midge Minium, MD ? ?Subjective:  ? ?HPI: ?Patient is a 66 y.o. female here for follow up of adhesive capsulitis of the right shoulder with partial biceps and supraspinatus tears in addition to hamstring tightness and tenderness over her right ischial tuberosity. Frozen shoulder first presented in December. She states that she has been regularly performing prescribed mobility exercises although is limited by time. Has continued Jazzercise with some modifications to protect hamstring. Overall her shoulder range of motion is slightly improved from last visit and her hamstring injury has not worsened.  ? ?Patient notes new onset left neck tightness that she attributes to compensatory overuse.  ? ?Caring for her elderly mother remains a large stressor.  Poor sleep patterns.  She has restarted the amitriptyline.NTG flared her rosacea. ? ? ?Past Medical History:  ?Diagnosis Date  ? Basal cell carcinoma   ? face  ? Breast nodule 04/1996  ? left  ? Depression   ? reactive  ? Iliotibial band syndrome of right side 09/2011  ? Myalgia   ? back and hips   ? Talipes cavus 02/2010  ? ? ?Current Outpatient Medications on File Prior to Visit  ?Medication Sig Dispense Refill  ? amitriptyline (ELAVIL) 25 MG tablet Take 1 tablet (25 mg total) by mouth at bedtime. 30 tablet 1  ? Calcium Carbonate-Vit D-Min (CALCIUM 1200 PO) Take by mouth daily.    ? cyanocobalamin 100 MCG tablet Take 100 mcg by mouth daily.    ? estrogen, conjugated,-medroxyprogesterone (PREMPRO) 0.3-1.5 MG tablet Take 1 tablet by mouth daily. 90 tablet 4  ? Multiple Vitamin (MULTIVITAMIN) tablet Take 1 tablet by mouth daily.    ? nitroGLYCERIN (NITRODUR - DOSED IN MG/24 HR) 0.2 mg/hr patch Use 1/4 patch daily to the affected area 30 patch 1  ? pyridOXINE (VITAMIN B-6) 100 MG tablet Take 100 mg by mouth daily.    ? sertraline (ZOLOFT) 100 MG tablet Take 1/2 tab ('50mg'$ ) every day. 45 tablet 4  ? ?No current facility-administered medications on file prior to  visit.  ? ? ?Past Surgical History:  ?Procedure Laterality Date  ? DILATATION & CURRETTAGE/HYSTEROSCOPY WITH RESECTOCOPE N/A 07/25/2015  ? Procedure: DILATATION & CURETTAGE/HYSTEROSCOPY;  Surgeon: Megan Salon, MD;  Location: [facility];  Service: Gynecology;  Laterality: N/A;  ? MOHS SURGERY  2021  ? ? ?Allergies  ?Allergen Reactions  ? Chocolate   ? Cocoa Other (See Comments)  ? Codeine Other (See Comments)  ?  Passes out  ? Doxycycline Rash  ? ? ?BP 118/72   Ht 5' 2.5" (1.588 m)   Wt 105 lb (47.6 kg)   LMP 10/23/2013   BMI 18.90 kg/m?  ? ? ?  10/24/2021  ? 10:49 AM  ?[place] Adult Exercise  ?Frequency of aerobic exercise (# of days/week) 6  ?Average time in minutes 30  ?Frequency of strengthening activities (# of days/week) 6  ? ? ?   ? View : No data to display.  ?  ?  ?  ? ? ?    ?Objective:  ?Physical Exam: ? ?Gen: NAD, comfortable in exam room ?CV: Regular rate, well perfused ?Resp: No increased work of breathing, coughing or wheezing ?Psych: Normal mood and affect.  ?MSK: Range of motion in right shoulder mildly improved from last exam. Forward flexion to 130 degrees, abduction to about 110 degrees although painful, internal rotation back scratch to L1. Good strength testing in internal and external rotation as well as flexion and abduction.  Right hamstrings good range of motion and strength. Point tenderness over right ischial tuberosity insertion. Left hamstrings with excellent strength, flexibility and no tenderness.  ? ?Ultrasound: RT shoulder ?Partial tear of R biceps tendon with interval improvement. There is a limited area of hypoechoic change. ?Partial tear of R supraspinatus tendon with small area of tendon calcification. Hypoechoic change at foot plate is less.  This does not look like full thickness.  No significant retraction. ?Subscapularis is normal. ?Infraspinatus and teres minor normal. ? ?Impression: some interval healing of biceps and supraspinatus partial tears ? ?Ultrasound  of Right hamstring ?Slight calcification at the ischial tuberosity insertion with surrounding bursal edema noted by hypoechoic change ?All tendons appear intact ? ?Impression: Ischial bursitis following Hamstring strain ? ?Ultrasound and interpretation by Wolfgang Phoenix. Fields, MD ? ?  ?Assessment & Plan:  ?1. Adhesive capsulitis is slowly improving. Continue exercises and follow up in 6 weeks. May consider formal PT at that point if not improving. Discussed application of Arnica cream and consumption of beet juice for increased vasoactivity and healing of tendons. Continue amitriptyline. ?2. Hamstring strain shows evidence of calcification and swelling at the ischial tuberosity insertion. However, tendon is intact and strength is preserved. Continue application of ice after exercise. Continue regular stretching and add more daily hamstring mobility exercises. ? ? ?Donald Pore ?MS4, [employer] ? ?I observed and examined the patient with the medical student and repeated key history and exam. I agree with assessment and plan.  Note reviewed and modified by me. ?Ila Mcgill, MD ? ?

## 2022-02-20 NOTE — Assessment & Plan Note (Signed)
Strength is good ?Some swelling on [place] ?Keep up ice ?Askling exercises ?Add HS curls/ forward and backward lunge ?HS Swings ? ?Reck 6 weeks ?

## 2022-02-25 ENCOUNTER — Encounter: Payer: Self-pay | Admitting: Family Medicine

## 2022-04-03 ENCOUNTER — Ambulatory Visit: Payer: BC Managed Care – PPO | Admitting: Sports Medicine

## 2022-04-05 ENCOUNTER — Ambulatory Visit: Payer: BC Managed Care – PPO | Admitting: Sports Medicine

## 2022-04-10 ENCOUNTER — Ambulatory Visit: Payer: BC Managed Care – PPO | Admitting: Sports Medicine

## 2022-04-10 DIAGNOSIS — S76311A Strain of muscle, fascia and tendon of the posterior muscle group at thigh level, right thigh, initial encounter: Secondary | ICD-10-CM

## 2022-04-10 DIAGNOSIS — M7501 Adhesive capsulitis of right shoulder: Secondary | ICD-10-CM | POA: Diagnosis not present

## 2022-04-10 NOTE — Assessment & Plan Note (Signed)
Much improved ?Still somewhat tight but strength returned ? ?Keep up extender exercises ?No extreme lunges ? ?Muscle injuries related to caregiver stress - be aware  ?Cont amitriptyline 25 hs ?

## 2022-04-10 NOTE — Assessment & Plan Note (Signed)
Continued improvement in ROM ?Good strength on biceps testing ? ?She should continue pushing ROM ?With her home stresses she will cont. On HEP with Codman exercises ?Light strength work for biceps ? ?Reck 6 wks ?

## 2022-04-10 NOTE — Progress Notes (Signed)
CC; Right Frozen shoulder ? ?Rarely painful ?Now can get her arm across her chest and behind her ?Still difficult to reach overhead easily ?Doing home Codman exercises daily ? ?RT HS strain ?Given home exercises ?Not much pain now ?With jazzercise seh sometimes feels this on lunge at IT ?Definitely improved ? ?Left anterior hip pain ?Had bruising ?She is caring for mother with Parkinson's  ?Unsure if she strained this moving MOM or during exercise ?Pain much less now ? ?ROS ?Stress level high as mother recently had COVID ?Rest is inconsistent ? ?PE ?Pleasant thin F in NAD ?RT shoulder ?Forward flexion is up to 10% deficit ?External rotation30 deg RT but 40 deg left ?Shoulder extension improved ?Active abduction elevation only 100 deg/ With Passive I can get 140 deg ?IR is only down 5 to 10 % ? ?HS ?Now non tender at IT ?HS strength on RT is good ?H test to 89 deg RT but 95 deg Left ? ?Left anterior hip with full ROM ?Hip flexion testing is non painful ?

## 2022-05-14 DIAGNOSIS — L821 Other seborrheic keratosis: Secondary | ICD-10-CM | POA: Diagnosis not present

## 2022-05-14 DIAGNOSIS — L814 Other melanin hyperpigmentation: Secondary | ICD-10-CM | POA: Diagnosis not present

## 2022-05-14 DIAGNOSIS — D1724 Benign lipomatous neoplasm of skin and subcutaneous tissue of left leg: Secondary | ICD-10-CM | POA: Diagnosis not present

## 2022-05-14 DIAGNOSIS — D1801 Hemangioma of skin and subcutaneous tissue: Secondary | ICD-10-CM | POA: Diagnosis not present

## 2022-05-22 ENCOUNTER — Ambulatory Visit: Payer: BC Managed Care – PPO | Admitting: Sports Medicine

## 2022-05-22 DIAGNOSIS — M7501 Adhesive capsulitis of right shoulder: Secondary | ICD-10-CM

## 2022-06-21 ENCOUNTER — Other Ambulatory Visit: Payer: Self-pay | Admitting: Sports Medicine

## 2022-07-24 ENCOUNTER — Ambulatory Visit: Payer: BC Managed Care – PPO | Admitting: Sports Medicine

## 2022-07-24 VITALS — BP 122/64 | Ht 63.0 in | Wt 105.0 lb

## 2022-07-24 DIAGNOSIS — M7501 Adhesive capsulitis of right shoulder: Secondary | ICD-10-CM | POA: Diagnosis not present

## 2022-07-24 DIAGNOSIS — S46219A Strain of muscle, fascia and tendon of other parts of biceps, unspecified arm, initial encounter: Secondary | ICD-10-CM

## 2022-07-24 NOTE — Assessment & Plan Note (Addendum)
She has been progressing very well with her range of motion which is almost symmetric with her left, status post approximately 8-1/2 months since beginning of adhesive capsulitis episode.  She may discontinue amitriptyline 25 mg in the evening,continue home range of motion exercises and strengthening.  She may now incorporate overhead lifting, would restrict to 3 pounds.  She was also shown new exercises today for posterior capsule and scapular stabilization.  Patient to follow-up in 3 months.

## 2022-07-24 NOTE — Assessment & Plan Note (Addendum)
She continues to have improvement with range of motion as well as strength she has been doing bicep curls and supination with 3 pound weights. Continue with strengthening activities. [place] today showed improved fluid surrounding the tendon as compared to previous study reports.  She continues to heal this bicipital tendon tear.  Anticipate she will do very well from this injury.  Return to office if any worsening of symptoms occur.

## 2022-07-24 NOTE — Patient Instructions (Signed)
You have made great progress and anticipate that she will continue to heal.  Over the next couple months she may begin doing light weight, 3 pounds overhead lifting to continue with strengthening process.  You may finish off the amitriptyline that you have, no need to refill at this point.  You may slowly decrease your stretch time to incorporate other strengthening activities as we showed you today.  Follow-up in about 3 months or sooner if needed

## 2022-07-24 NOTE — Progress Notes (Signed)
   Established Patient Office Visit  Subjective   Patient ID: Laura Munoz, female    DOB: 11/22/1956  Age: 66 y.o. MRN: [id number]  Right shoulder follow-up.  Laura Munoz presents today for 8-week follow-up of right shoulder adhesive capsulitis.  She has been completing her home range of motion exercises, workout classes and taking amitriptyline 25 mg in the evening.  She has continued to improve greatly.  She is curious today about the healing of her biceps tendon.  She has been doing weighted bicep curls and supination exercises.  She has not been performing any weighted exercises overhead.  She would like to know when she can begin those again.  She denies any new injuries, radiation of her pain down her arm, or numbness and tingling.  ROS as listed above in HPI    Objective:     BP 122/64   Ht '5\' 3"'$  (1.6 m)   Wt 105 lb (47.6 kg)   LMP 10/23/2013   BMI 18.60 kg/m   Physical Exam Vitals reviewed.  Constitutional:      General: She is not in acute distress.    Appearance: Normal appearance. She is not ill-appearing or toxic-appearing.  HENT:     Head: Normocephalic.  Pulmonary:     Effort: Pulmonary effort is normal.  Neurological:     Mental Status: She is alert.   Right shoulder: No obvious deformity or asymmetry.  No ecchymosis or effusion.  Some tenderness to palpation over the bicipital groove.  Range of motion, forward flexion approximately 170 degrees, Abduction 170 degrees, external rotation limited to 20 degrees, internal rotation to approximately the level of T6/7 unassisted.  Strength 5/5 abduction and forward flexion.  Negative Jobe's test.   Ultrasound of shoulder-right  Biceps tendon - visualized, small about of fluid surrounding the proximal attachment of the tendon without obvious evidence of fiber disruption on short or long axis.   Summary and additional findings-Bicipital tendon tear healing/scar tissue forming.  Ultrasound performed and interpretation by  Laura Ao B. Oneida Alar, MD and Laura Munoz. Laura Porta, DO    Assessment & Plan:   Problem List Items Addressed This Visit       Musculoskeletal and Integument   Biceps tendon tear    She continues to have improvement with range of motion as well as strength she has been doing bicep curls and supination with 3 pound weights. Continue with strengthening activities. [place] today showed improved fluid surrounding the tendon as compared to previous study reports.  She continues to heal this bicipital tendon tear.  Anticipate she will do very well from this injury.  Return to office if any worsening of symptoms occur.      Frozen shoulder syndrome - Primary    She has been progressing very well with her range of motion which is almost symmetric with her left, status post approximately 8-1/2 months since beginning of adhesive capsulitis episode.  She may discontinue amitriptyline 25 mg in the evening,continue home range of motion exercises and strengthening.  She may now incorporate overhead lifting, would restrict to 3 pounds.  She was also shown new exercises today for posterior capsule and scapular stabilization.  Patient to follow-up in 3 months.       Return in about 3 months (around 10/24/2022), or if symptoms worsen or fail to improve.    Laura Guise, DO

## 2022-08-06 ENCOUNTER — Other Ambulatory Visit: Payer: Self-pay | Admitting: Obstetrics & Gynecology

## 2022-08-06 DIAGNOSIS — Z1231 Encounter for screening mammogram for malignant neoplasm of breast: Secondary | ICD-10-CM

## 2022-10-30 ENCOUNTER — Ambulatory Visit: Payer: Self-pay

## 2022-10-30 ENCOUNTER — Ambulatory Visit (INDEPENDENT_AMBULATORY_CARE_PROVIDER_SITE_OTHER): Payer: BC Managed Care – PPO | Admitting: Sports Medicine

## 2022-10-30 VITALS — BP 106/74 | Ht 63.0 in | Wt 104.0 lb

## 2022-10-30 DIAGNOSIS — M7501 Adhesive capsulitis of right shoulder: Secondary | ICD-10-CM | POA: Diagnosis not present

## 2022-10-30 DIAGNOSIS — S46219A Strain of muscle, fascia and tendon of other parts of biceps, unspecified arm, initial encounter: Secondary | ICD-10-CM | POA: Diagnosis not present

## 2022-10-30 NOTE — Assessment & Plan Note (Signed)
She has greatly improved strength in light of her bicipital tendon tear approximately 1 year ago.  Recommend she continue with her strength exercises, she may gradually increase her weights as tolerated.

## 2022-10-30 NOTE — Patient Instructions (Signed)
You are doing phenomenally well with your range of motion and strength.  Continue with your weighted exercises however would recommend adding the spokes on a wheel weighted arm raises to about 90 degrees forward, at a 45 degree angle and lateral.  Once you have been doing this for a couple of weeks with your 3 pound weight you may begin increasing to lift above your head. He may also begin to ease back into planks and push-ups.  You may use this right arm fully for any of your activities of daily living. Follow-up as needed

## 2022-10-30 NOTE — Assessment & Plan Note (Signed)
Patient has greatly improved range of motion, very slight deficit with forward flexion and external rotation.  She may continue with range of motion exercises and strength training.  She may begin forward arm raises and lateral arm raises.  When she is comfortable with this after couple weeks she may begin to incorporate overhead weighted activities.  Overall she has done extremely really well and may continue to have very slight improvements. We will follow-up with her as needed.

## 2022-10-30 NOTE — Progress Notes (Signed)
Established Patient Office Visit  Subjective   Patient ID: Laura Munoz, female    DOB: Oct 07, 1956  Age: 66 y.o. MRN: [id number]  Follow-up right shoulder.  Ms. [name] presents today for follow-up of right shoulder issues.  She was last seen approximately 3 months ago.  She continues to improve from the standpoint of her previously diagnosed adhesive capsulitis, biceps tendon tear and supraspinatus tear.  She has been diligently working on bicep curls with 3 pound weight and began overhead push yesterday with no pain or reproduction of symptoms.  She has been holding off on push-ups and planks.  She denies any new injuries or radiating pain down her arm or numbness and tingling.   ROS as listed above in HPI    Objective:     BP 106/74   Ht '5\' 3"'$  (1.6 m)   Wt 104 lb (47.2 kg)   LMP 10/23/2013   BMI 18.42 kg/m   Physical Exam Vitals reviewed.  Constitutional:      General: She is not in acute distress.    Appearance: Normal appearance. She is not ill-appearing, toxic-appearing or diaphoretic.  Pulmonary:     Effort: Pulmonary effort is normal.  Neurological:     Mental Status: She is alert.   Right shoulder: No obvious deformity or asymmetry.  No ecchymosis or edema.  No tenderness to palpation over the [facility] joint or posterior shoulder.  Some very slight tenderness to palpation over the lateral shoulder.  Range of motion with forward flexion to 175 degrees, full range of motion abduction, external rotation to about 45 degrees.  Strength 5/5 forward flexion, abduction.  Negative empty can testing.  Distal pulses, radial 2+ grip strength 5/5.  Limited ultrasound evaluation: Right shoulder Biceps tendon was visualized in both longitudinal and horizontal axis.  Physiologic amount of hypoechoic fluid surrounding the tendon.  There is a small defect near the proximal aspect of the tendon however this is smaller as compared to imaging on 04/2022. Supraspinatus was visualized in static  and dynamic views, no obvious defect of tendon fibers or hypoechoic changes.  Impression: Resolution of swelling surrounding the biceps tendon, evidence of small bicipital tear that has been improving based on review of previous imaging.  No evidence of supraspinatus tendon tear.  Ultrasound and interpretation by Dr. Arlis Porta and Wolfgang Phoenix. Fields, MD    Assessment & Plan:   Problem List Items Addressed This Visit       Musculoskeletal and Integument   Biceps tendon tear - Primary    She has greatly improved strength in light of her bicipital tendon tear approximately 1 year ago.  Recommend she continue with her strength exercises, she may gradually increase her weights as tolerated.      Relevant Orders   [place] COMPLETE JOINT SPACE STRUCTURES UP RIGHT   Frozen shoulder syndrome    Patient has greatly improved range of motion, very slight deficit with forward flexion and external rotation.  She may continue with range of motion exercises and strength training.  She may begin forward arm raises and lateral arm raises.  When she is comfortable with this after couple weeks she may begin to incorporate overhead weighted activities.  Overall she has done extremely really well and may continue to have very slight improvements. We will follow-up with her as needed.       Return if symptoms worsen or fail to improve.    Elmore Guise, DO  I observed and examined the patient  with the [facility] resident and agree with assessment and plan.  Note reviewed and modified by me. Patient has essentially resolved her shoulder issues. Ila Mcgill, MD

## 2022-11-12 ENCOUNTER — Ambulatory Visit
Admission: RE | Admit: 2022-11-12 | Discharge: 2022-11-12 | Disposition: A | Discharge status: Home / Self Care | Payer: BC Managed Care – PPO | Source: Ambulatory Visit | Attending: Obstetrics & Gynecology | Admitting: Obstetrics & Gynecology

## 2022-11-12 ENCOUNTER — Ambulatory Visit: Payer: BC Managed Care – PPO

## 2022-11-12 DIAGNOSIS — Z1231 Encounter for screening mammogram for malignant neoplasm of breast: Secondary | ICD-10-CM

## 2022-12-14 ENCOUNTER — Encounter (HOSPITAL_BASED_OUTPATIENT_CLINIC_OR_DEPARTMENT_OTHER): Payer: Self-pay | Admitting: Obstetrics & Gynecology

## 2022-12-14 ENCOUNTER — Other Ambulatory Visit (HOSPITAL_COMMUNITY)
Admission: RE | Admit: 2022-12-14 | Discharge: 2022-12-14 | Disposition: A | Discharge status: Home / Self Care | Payer: BC Managed Care – PPO | Source: Ambulatory Visit | Attending: Obstetrics & Gynecology | Admitting: Obstetrics & Gynecology

## 2022-12-14 ENCOUNTER — Ambulatory Visit (INDEPENDENT_AMBULATORY_CARE_PROVIDER_SITE_OTHER): Payer: BC Managed Care – PPO | Admitting: Obstetrics & Gynecology

## 2022-12-14 VITALS — BP 120/78 | HR 60 | Ht 62.5 in | Wt 106.4 lb

## 2022-12-14 DIAGNOSIS — Z124 Encounter for screening for malignant neoplasm of cervix: Secondary | ICD-10-CM | POA: Diagnosis not present

## 2022-12-14 DIAGNOSIS — Z658 Other specified problems related to psychosocial circumstances: Secondary | ICD-10-CM

## 2022-12-14 DIAGNOSIS — Z01419 Encounter for gynecological examination (general) (routine) without abnormal findings: Secondary | ICD-10-CM | POA: Diagnosis not present

## 2022-12-14 DIAGNOSIS — M85851 Other specified disorders of bone density and structure, right thigh: Secondary | ICD-10-CM

## 2022-12-14 DIAGNOSIS — Z79890 Hormone replacement therapy (postmenopausal): Secondary | ICD-10-CM | POA: Diagnosis not present

## 2022-12-14 MED ORDER — SERTRALINE HCL 100 MG PO TABS: ORAL_TABLET | ORAL | 4 refills | Status: DC

## 2022-12-14 MED ORDER — PREMPRO 0.3-1.5 MG PO TABS: 1.0000 | ORAL_TABLET | Freq: Every day | ORAL | 4 refills | Status: DC

## 2022-12-14 NOTE — Progress Notes (Signed)
67 y.o. G0P0000 Married White or Caucasian female here for annual exam.  Mother still living with her and husband.  She is in her 65's.  Mother is declining.  Husband having some medical issues this year.  Had a lot of issues with her right shoulder last year.  This is finally almost back to normal.    HRT discussed.  She is going to consider taking every other day.  Risks discussed.   Taking '50mg'$  of sertraline.  No issues.    Patient's last menstrual period was 10/23/2013.          The current method of family planning is post menopausal status.    Exercising: Yes.    Smoker:  no  Health Maintenance: Pap:  11/14/2022 History of abnormal Pap:  no MMG:  11/14/2022 Colonoscopy:  11/15/2019, follow up 10 years BMD:   10/2021, mild osteopenia Screening Labs: 11/23/2020   reports that she has never smoked. She has never used smokeless tobacco. She reports current alcohol use of about 1.0 standard drink of alcohol per week. She reports that she does not use drugs.  Past Medical History:  Diagnosis Date   Basal cell carcinoma    face   Breast nodule 04/1996   left   Depression    reactive   Iliotibial band syndrome of right side 09/2011   Myalgia    back and hips    Talipes cavus 02/2010    Past Surgical History:  Procedure Laterality Date   DILATATION & CURRETTAGE/HYSTEROSCOPY WITH RESECTOCOPE N/A 07/25/2015   Procedure: DILATATION & CURETTAGE/HYSTEROSCOPY;  Surgeon: Megan Salon, MD;  Location: [facility];  Service: Gynecology;  Laterality: N/A;   MOHS SURGERY  2021    Current Outpatient Medications  Medication Sig Dispense Refill   Calcium Carbonate-Vit D-Min (CALCIUM 1200 PO) Take by mouth daily.     cyanocobalamin 100 MCG tablet Take 100 mcg by mouth daily.     Multiple Vitamin (MULTIVITAMIN) tablet Take 1 tablet by mouth daily.     pyridOXINE (VITAMIN B-6) 100 MG tablet Take 100 mg by mouth daily.     sertraline (ZOLOFT) 100 MG tablet Take 1/2 tab ('50mg'$ ) every day. 45  tablet 4   estrogen, conjugated,-medroxyprogesterone (PREMPRO) 0.3-1.5 MG tablet Take 1 tablet by mouth daily. 90 tablet 4   No current facility-administered medications for this visit.    Family History  Problem Relation Age of Onset   Prostate cancer Father    Dementia Father    Osteopenia Mother    Parkinson's disease Mother    Breast cancer Paternal Grandmother    Asthma Sister    Early death Brother    Coronary artery disease Maternal Grandmother    Cancer Maternal Grandfather    Asthma Sister    Colon cancer Neg Hx    Esophageal cancer Neg Hx    Rectal cancer Neg Hx    Stomach cancer Neg Hx     ROS: Constitutional: negative Genitourinary:negative  Exam:   Pulse 60   Ht 5' 2.5" (1.588 m)   Wt 106 lb 6.4 oz (48.3 kg)   LMP 10/23/2013   BMI 19.15 kg/m   Height: 5' 2.5" (158.8 cm)  General appearance: alert, cooperative and appears stated age Head: Normocephalic, without obvious abnormality, atraumatic Neck: no adenopathy, supple, symmetrical, trachea midline and thyroid normal to inspection and palpation Lungs: clear to auscultation bilaterally Breasts: normal appearance, no masses or tenderness Heart: regular rate and rhythm Abdomen: soft, non-tender; bowel sounds normal;  no masses,  no organomegaly Extremities: extremities normal, atraumatic, no cyanosis or edema Skin: Skin color, texture, turgor normal. No rashes or lesions Lymph nodes: Cervical, supraclavicular, and axillary nodes normal. No abnormal inguinal nodes palpated Neurologic: Grossly normal   Pelvic: External genitalia:  no lesions              Urethra:  normal appearing urethra with no masses, tenderness or lesions              Bartholins and Skenes: normal                 Vagina: normal appearing vagina with normal color and no discharge, no lesions              Cervix: no lesions              Pap taken: Yes.   Bimanual Exam:  Uterus:  normal size, contour, position, consistency, mobility,  non-tender              Adnexa: normal adnexa and no mass, fullness, tenderness               Rectovaginal: Confirms               Anus:  normal sphincter tone, no lesions  Chaperone, Octaviano Batty, CMA, was present for exam.  Assessment/Plan: 1. Well woman exam with routine gynecological exam - Pap smear obtained today - Mammogram 10/2022 - Colonoscopy 2020 - Bone mineral density 10/2021 - lab work done w12/2021 - vaccines reviewed/updated  2. Hormone replacement therapy (HRT) - estrogen, conjugated,-medroxyprogesterone (PREMPRO) 0.3-1.5 MG tablet; Take 1 tablet by mouth daily.  Dispense: 90 tablet; Refill: 4  3. Psychosocial stressors - sertraline (ZOLOFT) 100 MG tablet; Take 1/2 tab ('50mg'$ ) every day.  Dispense: 45 tablet; Refill: 4  4. Osteopenia of neck of right femur

## 2022-12-20 LAB — CYTOLOGY - PAP: Diagnosis: NEGATIVE

## 2022-12-30 ENCOUNTER — Encounter (HOSPITAL_BASED_OUTPATIENT_CLINIC_OR_DEPARTMENT_OTHER): Payer: Self-pay

## 2022-12-30 ENCOUNTER — Emergency Department (HOSPITAL_BASED_OUTPATIENT_CLINIC_OR_DEPARTMENT_OTHER): Payer: BC Managed Care – PPO

## 2022-12-30 ENCOUNTER — Other Ambulatory Visit: Payer: Self-pay

## 2022-12-30 ENCOUNTER — Emergency Department (HOSPITAL_BASED_OUTPATIENT_CLINIC_OR_DEPARTMENT_OTHER)
Admission: EM | Admit: 2022-12-30 | Discharge: 2022-12-30 | Disposition: A | Discharge status: Home / Self Care | Payer: BC Managed Care – PPO | Attending: Emergency Medicine | Admitting: Emergency Medicine

## 2022-12-30 DIAGNOSIS — N132 Hydronephrosis with renal and ureteral calculous obstruction: Secondary | ICD-10-CM | POA: Diagnosis not present

## 2022-12-30 DIAGNOSIS — N179 Acute kidney failure, unspecified: Secondary | ICD-10-CM | POA: Insufficient documentation

## 2022-12-30 DIAGNOSIS — N200 Calculus of kidney: Secondary | ICD-10-CM | POA: Diagnosis not present

## 2022-12-30 DIAGNOSIS — R9431 Abnormal electrocardiogram [ECG] [EKG]: Secondary | ICD-10-CM | POA: Diagnosis not present

## 2022-12-30 DIAGNOSIS — N201 Calculus of ureter: Secondary | ICD-10-CM | POA: Insufficient documentation

## 2022-12-30 DIAGNOSIS — M545 Low back pain, unspecified: Secondary | ICD-10-CM | POA: Diagnosis not present

## 2022-12-30 LAB — COMPREHENSIVE METABOLIC PANEL
ALT: 41 U/L (ref 0–44)
AST: 43 U/L — ABNORMAL HIGH (ref 15–41)
Albumin: 4.7 g/dL (ref 3.5–5.0)
Alkaline Phosphatase: 40 U/L (ref 38–126)
Anion gap: 10 (ref 5–15)
BUN: 28 mg/dL — ABNORMAL HIGH (ref 8–23)
CO2: 26 mmol/L (ref 22–32)
Calcium: 9.9 mg/dL (ref 8.9–10.3)
Chloride: 92 mmol/L — ABNORMAL LOW (ref 98–111)
Creatinine, Ser: 1.44 mg/dL — ABNORMAL HIGH (ref 0.44–1.00)
GFR, Estimated: 41 mL/min — ABNORMAL LOW (ref >60)
Glucose, Bld: 109 mg/dL — ABNORMAL HIGH (ref 70–99)
Potassium: 3.9 mmol/L (ref 3.5–5.1)
Sodium: 128 mmol/L — ABNORMAL LOW (ref 135–145)
Total Bilirubin: 0.7 mg/dL (ref 0.3–1.2)
Total Protein: 8.6 g/dL — ABNORMAL HIGH (ref 6.5–8.1)

## 2022-12-30 LAB — URINALYSIS, ROUTINE W REFLEX MICROSCOPIC
Bacteria, UA: NONE SEEN
Bilirubin Urine: NEGATIVE
Glucose, UA: NEGATIVE mg/dL
Ketones, ur: NEGATIVE mg/dL
Leukocytes,Ua: NEGATIVE
Nitrite: NEGATIVE
Protein, ur: NEGATIVE mg/dL
Specific Gravity, Urine: 1.02 (ref 1.005–1.030)
pH: 5 (ref 5.0–8.0)

## 2022-12-30 LAB — CBC
HCT: 41.3 % (ref 36.0–46.0)
Hemoglobin: 14.2 g/dL (ref 12.0–15.0)
MCH: 30.1 pg (ref 26.0–34.0)
MCHC: 34.4 g/dL (ref 30.0–36.0)
MCV: 87.7 fL (ref 80.0–100.0)
Platelets: 353 10*3/uL (ref 150–400)
RBC: 4.71 MIL/uL (ref 3.87–5.11)
RDW: 13.2 % (ref 11.5–15.5)
WBC: 12.7 10*3/uL — ABNORMAL HIGH (ref 4.0–10.5)
nRBC: 0 % (ref 0.0–0.2)

## 2022-12-30 LAB — LIPASE, BLOOD: Lipase: 16 U/L (ref 11–51)

## 2022-12-30 MED ORDER — SODIUM CHLORIDE 0.9 % IV BOLUS: 500.0000 mL | Freq: Once | INTRAVENOUS | Status: DC

## 2022-12-30 MED ORDER — IBUPROFEN 600 MG PO TABS: 600.0000 mg | ORAL_TABLET | Freq: Four times a day (QID) | ORAL | 0 refills | Status: DC | PRN

## 2022-12-30 MED ORDER — ONDANSETRON HCL 4 MG/2ML IJ SOLN
4.0000 mg | Freq: Once | INTRAMUSCULAR | Status: AC
  Administered 2022-12-30: 4 mg via INTRAVENOUS
  Filled 2022-12-30: qty 2

## 2022-12-30 MED ORDER — TAMSULOSIN HCL 0.4 MG PO CAPS: 0.4000 mg | ORAL_CAPSULE | Freq: Every day | ORAL | 0 refills | Status: DC

## 2022-12-30 MED ORDER — SODIUM CHLORIDE 0.9 % IV BOLUS
1000.0000 mL | Freq: Once | INTRAVENOUS | Status: AC
  Administered 2022-12-30: 1000 mL via INTRAVENOUS

## 2022-12-30 MED ORDER — KETOROLAC TROMETHAMINE 15 MG/ML IJ SOLN
15.0000 mg | Freq: Once | INTRAMUSCULAR | Status: AC
  Administered 2022-12-30: 15 mg via INTRAVENOUS
  Filled 2022-12-30: qty 1

## 2022-12-30 MED ORDER — ONDANSETRON HCL 4 MG PO TABS: 4.0000 mg | ORAL_TABLET | Freq: Four times a day (QID) | ORAL | 0 refills | Status: DC

## 2022-12-30 MED ORDER — TAMSULOSIN HCL 0.4 MG PO CAPS
0.4000 mg | ORAL_CAPSULE | Freq: Once | ORAL | Status: AC
  Administered 2022-12-30: 0.4 mg via ORAL
  Filled 2022-12-30: qty 1

## 2022-12-30 NOTE — ED Notes (Signed)
Pt verbalized understanding of d/c instructions, meds, and followup care. Denies questions. VSS, no distress noted. Plan to call urology. Steady gait to exit with all belongings.

## 2022-12-30 NOTE — ED Triage Notes (Signed)
Pt presents POV from home w/family, ambulatory  Pt presents with pain Left flank pain started yesterday after doing an aerobic class and a stretch class. Pain to her lower back last night, today began having Left lower back pain, more toward the flank area. Pt reports yesterday she felt like she was going to pass out, had an accidental BM yesterday immediately after feeling like she was going to pass out. Six episodes of vomiting that subsided approx 0030 this am   Pt denies any hx of kidney stones or urinary symptoms

## 2022-12-30 NOTE — Discharge Instructions (Signed)
The workup today was overall consistent with 3 mm left-sided stone in the ureter.  As discussed, we will treat this with ibuprofen as needed for pain as well as daily Flomax.  I discussed case with urology who is expecting outpatient management of symptoms with repeat laboratory studies to check your renal function.  Attached is information so please call them at your earliest convenience.  Please do not hesitate to return to emergency department for worrisome signs and symptoms we discussed become apparent.

## 2022-12-30 NOTE — ED Provider Notes (Signed)
[facility] Provider Note   CSN: [id number] Arrival date & time: 12/30/22  1241     History  Chief Complaint  Patient presents with   Back Pain   Emesis    Laura Munoz is a 67 y.o. female.   Back Pain Emesis   67 year old female presents emergency department with complaints of left-sided flank pain, nausea, vomiting.  Patient states that pain began around 11 AM yesterday when she was driving back from a workout class.  She notes sharp radiating pain to her left inguinal area.  Denies history of similar symptoms in the past.  States that since then, has noted intermittent left-sided sharp back pain with radiation to the left inguinal area.  Denies urinary symptoms including dysuria, hematuria, urinary 2 concerns/frequency.  Reports feelings of nausea with episodes of emesis yesterday.  Denies fever, chest pain, shortness of breath, hematemesis, vaginal symptoms, change in bowel habits.  Past medical history significant for depression, breast nodule  Home Medications Prior to Admission medications   Medication Sig Start Date End Date Taking? Authorizing Provider  ibuprofen (ADVIL) 600 MG tablet Take 1 tablet (600 mg total) by mouth every 6 (six) hours as needed. 12/30/22  Yes Dion Saucier A, PA  ondansetron (ZOFRAN) 4 MG tablet Take 1 tablet (4 mg total) by mouth every 6 (six) hours. 12/30/22  Yes Dion Saucier A, PA  tamsulosin (FLOMAX) 0.4 MG CAPS capsule Take 1 capsule (0.4 mg total) by mouth daily. 12/30/22  Yes Dion Saucier A, PA  Calcium Carbonate-Vit D-Min (CALCIUM 1200 PO) Take by mouth daily.    [provider]  cyanocobalamin 100 MCG tablet Take 100 mcg by mouth daily.    [provider]  estrogen, conjugated,-medroxyprogesterone (PREMPRO) 0.3-1.5 MG tablet Take 1 tablet by mouth daily. 12/14/22   Megan Salon, MD  Multiple Vitamin (MULTIVITAMIN) tablet Take 1 tablet by mouth daily.    [provider]  pyridOXINE (VITAMIN B-6) 100 MG tablet Take 100 mg by mouth daily.    [provider]  sertraline (ZOLOFT) 100 MG tablet Take 1/2 tab ('50mg'$ ) every day. 12/14/22   Megan Salon, MD      Allergies    Chocolate, Cocoa, Codeine, and Doxycycline    Review of Systems   Review of Systems  Gastrointestinal:  Positive for vomiting.  Musculoskeletal:  Positive for back pain.  All other systems reviewed and are negative.   Physical Exam Updated Vital Signs BP (!) 125/90 (BP Location: Left Arm)   Pulse 61   Temp 98.2 F (36.8 C) (Oral)   Resp 18   LMP 10/23/2013   SpO2 100%  Physical Exam Vitals and nursing note reviewed.  Constitutional:      General: She is not in acute distress.    Appearance: She is well-developed.  HENT:     Head: Normocephalic and atraumatic.  Eyes:     Conjunctiva/sclera: Conjunctivae normal.  Cardiovascular:     Rate and Rhythm: Normal rate and regular rhythm.     Heart sounds: No murmur heard. Pulmonary:     Effort: Pulmonary effort is normal. No respiratory distress.     Breath sounds: Normal breath sounds.  Abdominal:     Palpations: Abdomen is soft.     Tenderness: There is left CVA tenderness.  Musculoskeletal:        General: No swelling.     Cervical back: Neck supple.  Skin:    General: Skin is  warm and dry.     Capillary Refill: Capillary refill takes less than 2 seconds.  Neurological:     Mental Status: She is alert.  Psychiatric:        Mood and Affect: Mood normal.     ED Results / Procedures / Treatments   Labs (all labs ordered are listed, but only abnormal results are displayed) Labs Reviewed  COMPREHENSIVE METABOLIC PANEL - Abnormal; Notable for the following components:      Result Value   Sodium 128 (*)    Chloride 92 (*)    Glucose, Bld 109 (*)    BUN 28 (*)    Creatinine, Ser 1.44 (*)    Total Protein 8.6 (*)    AST 43 (*)    GFR, Estimated 41 (*)    All other components within normal  limits  CBC - Abnormal; Notable for the following components:   WBC 12.7 (*)    All other components within normal limits  URINALYSIS, ROUTINE W REFLEX MICROSCOPIC - Abnormal; Notable for the following components:   Hgb urine dipstick MODERATE (*)    All other components within normal limits  LIPASE, BLOOD    EKG None  Radiology CT Renal Stone Study  Result Date: 12/30/2022 CLINICAL DATA:  Left flank pain starting yesterday.  Low back pain. EXAM: CT ABDOMEN AND PELVIS WITHOUT CONTRAST TECHNIQUE: Multidetector CT imaging of the abdomen and pelvis was performed following the standard protocol without IV contrast. RADIATION DOSE REDUCTION: This exam was performed according to the departmental dose-optimization program which includes automated exposure control, adjustment of the mA and/or kV according to patient size and/or use of iterative reconstruction technique. COMPARISON:  None Available. FINDINGS: Lower chest: Unremarkable Hepatobiliary: Unremarkable Pancreas: Unremarkable Spleen: Unremarkable Adrenals/Urinary Tract: Both adrenal glands appear normal. Asymmetric left perirenal stranding noted along with mild left hydronephrosis mild left hydroureter extending down to a 0.3 cm left UVJ stone on image 69 series 2. No other urinary tract calculi are identified. The left perirenal stranding and fluid tracking along the left retroperitoneum and left pelvic sidewall is of enough magnitude to raise the possibility of forniceal rupture. Stomach/Bowel: Unremarkable Vascular/Lymphatic: Unremarkable Reproductive: Unremarkable Other: Small amount of pelvic ascites. Musculoskeletal: Moderate degenerative hip arthropathy bilaterally. Loss of disc height and endplate sclerosis at L5-7 and L5-S1. Otherwise mild lumbar spondylosis and degenerative disc disease. IMPRESSION: 1. 0.3 cm left UVJ stone associated with mild left hydronephrosis and left hydroureter. The left perirenal stranding and fluid tracking along  the left retroperitoneum and left pelvic sidewall is of enough magnitude to raise the possibility of forniceal rupture. 2. Small amount of pelvic ascites. 3. Lumbar spondylosis and degenerative disc disease. 4. Moderate degenerative hip arthropathy bilaterally. Electronically Signed   By: Van Clines M.D.   On: 12/30/2022 14:05    Procedures Procedures    Medications Ordered in ED Medications  tamsulosin (FLOMAX) capsule 0.4 mg (has no administration in time range)  ketorolac (TORADOL) 15 MG/ML injection 15 mg (has no administration in time range)  ondansetron (ZOFRAN) injection 4 mg (has no administration in time range)  sodium chloride 0.9 % bolus 1,000 mL (has no administration in time range)    ED Course/ Medical Decision Making/ A&P Clinical Course as of 12/30/22 1533  Sun Dec 30, 2022  1515 [facility] Dr. Garen Lah regarding the patient.  Discussed CT imaging findings of possible forniceal rupture and he recommended treatment as usual for kidney stone given that urine is without signs of infection. [  CR]    Clinical Course User Index [CR] Wilnette Kales, PA                             Medical Decision Making Amount and/or Complexity of Data Reviewed Labs: ordered. Radiology: ordered.  Risk Prescription drug management.   This patient presents to the ED for concern of flank pain, this involves an extensive number of treatment options, and is a complaint that carries with it a high risk of complications and morbidity.  The differential diagnosis includes MSK, pyelonephritis, nephrolithiasis, sepsis, SBO/LBO, diverticulitis, appendicitis, mesenteric ischemia, AAA, aortic dissection, fracture, strain sprain, dislocation   Co morbidities that complicate the patient evaluation  See HPI   Additional history obtained:  Additional history obtained from EMR External records from outside source obtained and reviewed including hospital records   Lab Tests:  I  Ordered, and personally interpreted labs.  The pertinent results include: Leukocytosis of 12.7.  No evidence anemia.  Platelets within range.  Hyponatremia with a sodium of 128 of which was supplemented via 1 L of normal saline intravenously.  Mild renal dysfunction with GFR 41, creatinine 1.44 and BUN of 28 of which findings were discussed with urology who agreed with outpatient management of patient's symptoms.  No transaminitis.  UA with moderate hemoglobin without signs of nitrite, leukocyte, bacteria; 21-50 RBCs.  Lipase within normal limits.   Imaging Studies ordered:  I ordered imaging studies including CT renal stone study  I independently visualized and interpreted imaging which showed 0.3 cm left UVJ stone with mild left hydronephrosis and hydroureter.  Left pararenal stranding and fluid tracking along the left retroperitoneum and left pelvic sidewall.  Small pelvic ascites.  Lumbar spondylosis and degenerative disc disease.  Moderate degenerative hip arthropathy bilaterally I agree with the radiologist interpretation  Cardiac Monitoring: / EKG:  The patient was maintained on a cardiac monitor.  I personally viewed and interpreted the cardiac monitored which showed an underlying rhythm of: Sinus rhythm   Consultations Obtained:  See ED course  Problem List / ED Course / Critical interventions / Medication management  Urolithiasis I ordered medication including 1 L normal saline, Toradol, Zofran, Flomax   Reevaluation of the patient after these medicines showed that the patient improved I have reviewed the patients home medicines and have made adjustments as needed   Social Determinants of Health:  Denies tobacco, illicit drug use   Test / Admission - Considered:  Ureterolithiasis/AKI Vitals signs within normal range and stable throughout visit. Laboratory/imaging studies significant for: See above Patient with evidence of left-sided urolithiasis with possible forniceal  rupture no evidence of AKI.  Case was discussed with urology Dr. As depicted in ED course.  Recommendation is outpatient management of symptoms as long as pain is under control.  Patient with also evidence of hyponatremia most likely secondary to volume loss of which was repleted via intravenous fluids.  Patient tolerating p.o. at this time to further electrolyte correction to be made outpatient.  Urine is without signs of infection.  Treatment plan discussed at length with patient and family and they acknowledge understanding were agreeable to said plan.  Close follow-up with urology recommended outpatient for reevaluation and repeat laboratory assessment of kidney function. Worrisome signs and symptoms were discussed with the patient, and the patient acknowledged understanding to return to the ED if noticed. Patient was stable upon discharge.          Final Clinical  Impression(s) / ED Diagnoses Final diagnoses:  Ureterolithiasis  AKI (acute kidney injury) ([facility])    Rx / DC Orders ED Discharge Orders          Ordered    tamsulosin (FLOMAX) 0.4 MG CAPS capsule  Daily        12/30/22 1521    ibuprofen (ADVIL) 600 MG tablet  Every 6 hours PRN        12/30/22 1521    ondansetron (ZOFRAN) 4 MG tablet  Every 6 hours        12/30/22 1530              Wilnette Kales, [place] 12/30/22 1533    Fredia Sorrow, MD 12/31/22 [phone]

## 2023-01-01 LAB — URINE CULTURE: Culture: <10000 — AB

## 2023-01-09 DIAGNOSIS — H0288A Meibomian gland dysfunction right eye, upper and lower eyelids: Secondary | ICD-10-CM | POA: Diagnosis not present

## 2023-01-09 DIAGNOSIS — H0288B Meibomian gland dysfunction left eye, upper and lower eyelids: Secondary | ICD-10-CM | POA: Diagnosis not present

## 2023-01-09 DIAGNOSIS — H18522 Epithelial (juvenile) corneal dystrophy, left eye: Secondary | ICD-10-CM | POA: Diagnosis not present

## 2023-01-09 DIAGNOSIS — H2513 Age-related nuclear cataract, bilateral: Secondary | ICD-10-CM | POA: Diagnosis not present

## 2023-01-11 DIAGNOSIS — N201 Calculus of ureter: Secondary | ICD-10-CM | POA: Diagnosis not present

## 2023-02-18 ENCOUNTER — Other Ambulatory Visit (HOSPITAL_BASED_OUTPATIENT_CLINIC_OR_DEPARTMENT_OTHER): Payer: Self-pay | Admitting: Obstetrics & Gynecology

## 2023-02-18 DIAGNOSIS — Z79890 Hormone replacement therapy (postmenopausal): Secondary | ICD-10-CM

## 2023-03-07 DIAGNOSIS — L578 Other skin changes due to chronic exposure to nonionizing radiation: Secondary | ICD-10-CM | POA: Diagnosis not present

## 2023-03-07 DIAGNOSIS — L821 Other seborrheic keratosis: Secondary | ICD-10-CM | POA: Diagnosis not present

## 2023-03-07 DIAGNOSIS — D229 Melanocytic nevi, unspecified: Secondary | ICD-10-CM | POA: Diagnosis not present

## 2023-03-07 DIAGNOSIS — L814 Other melanin hyperpigmentation: Secondary | ICD-10-CM | POA: Diagnosis not present

## 2023-03-31 ENCOUNTER — Encounter (HOSPITAL_BASED_OUTPATIENT_CLINIC_OR_DEPARTMENT_OTHER): Payer: Self-pay | Admitting: Obstetrics & Gynecology

## 2023-04-03 ENCOUNTER — Ambulatory Visit (HOSPITAL_BASED_OUTPATIENT_CLINIC_OR_DEPARTMENT_OTHER): Payer: BC Managed Care – PPO | Admitting: Medical

## 2023-04-03 ENCOUNTER — Encounter (HOSPITAL_BASED_OUTPATIENT_CLINIC_OR_DEPARTMENT_OTHER): Payer: Self-pay | Admitting: Medical

## 2023-04-03 VITALS — BP 128/88 | HR 69 | Ht 63.0 in | Wt 107.0 lb

## 2023-04-03 DIAGNOSIS — N9089 Other specified noninflammatory disorders of vulva and perineum: Secondary | ICD-10-CM

## 2023-04-03 NOTE — Progress Notes (Signed)
   History:  [name] is a 67 y.o. G0P0000 who presents to clinic today for evaluation of a cystic area on the perineum. This has been noticeable to the patient for one week. She denies drainage. This has been sensitive to touch. She has also had an increase in BMs lately for which wiping has caused more irritation.   The following portions of the patient's history were reviewed and updated as appropriate: allergies, current medications, family history, past medical history, social history, past surgical history and problem list.  Review of Systems:  Review of Systems  Constitutional:  Negative for fever.  Genitourinary:        + tenderness of perineum Neg - drainage      Objective:  Physical Exam BP (!) 128/90 (BP Location: Left Arm, Patient Position: Sitting, Cuff Size: Normal)   Pulse 69   Ht 5\' 3"  (1.6 m) Comment: Reported  Wt 107 lb (48.5 kg)   LMP 10/23/2013   BMI 18.95 kg/m  Physical Exam Exam conducted with a chaperone present.  Constitutional:      Appearance: Normal appearance. She is normal weight. She is not ill-appearing.  Cardiovascular:     Rate and Rhythm: Normal rate.  Pulmonary:     Effort: Pulmonary effort is normal.  Abdominal:     General: Abdomen is flat.     Palpations: Abdomen is soft.  Genitourinary:    General: Normal vulva.    Skin:    General: Skin is warm and dry.     Findings: No erythema.  Neurological:     Mental Status: She is alert and oriented to person, place, and time.  Psychiatric:        Mood and Affect: Mood normal.     Health Maintenance Due  Topic Date Due   HIV Screening  Never done   COVID-19 Vaccine (6 - 2023-24 season) 10/17/2022    Labs, imaging and previous visits in Epic and Care Everywhere reviewed  Assessment & Plan:  Perineal cyst  - Warm compresses BID, Sitz baths daily  - Monitor for worsening condition  - Monitor for drainage  - If fever or symptoms worsen call the office - If symptoms  have not improved in ~ 1 week contact office  - If symptoms resolve, return to [facility] in 1 year for annual as scheduled or sooner PRN   Marny Lowenstein, PA-C 04/03/2023 4:20 PM

## 2023-04-04 ENCOUNTER — Encounter (HOSPITAL_BASED_OUTPATIENT_CLINIC_OR_DEPARTMENT_OTHER): Payer: Self-pay

## 2023-07-12 DIAGNOSIS — H18522 Epithelial (juvenile) corneal dystrophy, left eye: Secondary | ICD-10-CM | POA: Diagnosis not present

## 2023-07-12 DIAGNOSIS — H5231 Anisometropia: Secondary | ICD-10-CM | POA: Diagnosis not present

## 2023-07-12 DIAGNOSIS — H04123 Dry eye syndrome of bilateral lacrimal glands: Secondary | ICD-10-CM | POA: Diagnosis not present

## 2023-09-25 ENCOUNTER — Telehealth: Payer: Self-pay | Admitting: Family Medicine

## 2023-09-25 NOTE — Telephone Encounter (Signed)
[facility] Night - C Client Site [facility] - Night Provider Lezlie Octave- MD Contact Type Call Who Is Calling Patient / Member / Family / Caregiver Caller Name Masel Haning Caller Phone Number [phone] Patient Name Laura Munoz Patient DOB [birth year] Call Type Message Only Information Provided Reason for Call Request for General Office Information Initial Comment Caller states has a appt Friday wants to know if she needs to fast before labs. Additional Comment office hours provided. Disp. Time Disposition Final User 09/24/2023 2:35:11 PM General Information Provided Yes Verlon Setting Call Closed By: Verlon Setting Transaction Date/Time: 09/24/2023 2:31:37 PM (ET)

## 2023-09-25 NOTE — Telephone Encounter (Signed)
Called pt, left VM letting her know she will need to fast for labs because it looks like she is due for lipids

## 2023-09-25 NOTE — Telephone Encounter (Signed)
Spoke with pt and offered lab visit before appt.  Pt refused and states she will fast before appt.

## 2023-09-26 DIAGNOSIS — Z85828 Personal history of other malignant neoplasm of skin: Secondary | ICD-10-CM | POA: Insufficient documentation

## 2023-09-27 ENCOUNTER — Ambulatory Visit (INDEPENDENT_AMBULATORY_CARE_PROVIDER_SITE_OTHER): Payer: BC Managed Care – PPO | Admitting: Family Medicine

## 2023-09-27 ENCOUNTER — Encounter: Payer: Self-pay | Admitting: Family Medicine

## 2023-09-27 VITALS — BP 122/70 | HR 58 | Temp 97.8°F | Ht 62.5 in | Wt 109.0 lb

## 2023-09-27 DIAGNOSIS — Z0000 Encounter for general adult medical examination without abnormal findings: Secondary | ICD-10-CM

## 2023-09-27 DIAGNOSIS — Z13220 Encounter for screening for lipoid disorders: Secondary | ICD-10-CM

## 2023-09-27 DIAGNOSIS — Z114 Encounter for screening for human immunodeficiency virus [HIV]: Secondary | ICD-10-CM

## 2023-09-27 LAB — BASIC METABOLIC PANEL WITH GFR
BUN: 21 mg/dL (ref 6–23)
CO2: 31 meq/L (ref 19–32)
Calcium: 9.6 mg/dL (ref 8.4–10.5)
Chloride: 101 meq/L (ref 96–112)
Creatinine, Ser: 0.8 mg/dL (ref 0.40–1.20)
GFR: 77.64 mL/min
Glucose, Bld: 86 mg/dL (ref 70–99)
Potassium: 3.9 meq/L (ref 3.5–5.1)
Sodium: 138 meq/L (ref 135–145)

## 2023-09-27 LAB — HEPATIC FUNCTION PANEL
ALT: 25 U/L (ref 0–35)
AST: 27 U/L (ref 0–37)
Albumin: 4.3 g/dL (ref 3.5–5.2)
Alkaline Phosphatase: 47 U/L (ref 39–117)
Bilirubin, Direct: 0 mg/dL (ref 0.0–0.3)
Total Bilirubin: 0.5 mg/dL (ref 0.2–1.2)
Total Protein: 7.7 g/dL (ref 6.0–8.3)

## 2023-09-27 LAB — LIPID PANEL
Cholesterol: 197 mg/dL (ref 0–200)
HDL: 72.1 mg/dL (ref >39.00)
LDL Cholesterol: 114 mg/dL — ABNORMAL HIGH (ref 0–99)
NonHDL: 124.61
Total CHOL/HDL Ratio: 3
Triglycerides: 55 mg/dL (ref 0.0–149.0)
VLDL: 11 mg/dL (ref 0.0–40.0)

## 2023-09-27 LAB — CBC WITH DIFFERENTIAL/PLATELET
Basophils Absolute: 0 10*3/uL (ref 0.0–0.1)
Basophils Relative: 0.9 % (ref 0.0–3.0)
Eosinophils Absolute: 0.1 10*3/uL (ref 0.0–0.7)
Eosinophils Relative: 1.9 % (ref 0.0–5.0)
HCT: 42 % (ref 36.0–46.0)
Hemoglobin: 13.6 g/dL (ref 12.0–15.0)
Lymphocytes Relative: 37.8 % (ref 12.0–46.0)
Lymphs Abs: 1.6 10*3/uL (ref 0.7–4.0)
MCHC: 32.3 g/dL (ref 30.0–36.0)
MCV: 92.8 fL (ref 78.0–100.0)
Monocytes Absolute: 0.4 10*3/uL (ref 0.1–1.0)
Monocytes Relative: 9.6 % (ref 3.0–12.0)
Neutro Abs: 2.1 10*3/uL (ref 1.4–7.7)
Neutrophils Relative %: 49.8 % (ref 43.0–77.0)
Platelets: 288 10*3/uL (ref 150.0–400.0)
RBC: 4.53 Mil/uL (ref 3.87–5.11)
RDW: 13.5 % (ref 11.5–15.5)
WBC: 4.2 10*3/uL (ref 4.0–10.5)

## 2023-09-27 LAB — TSH: TSH: 4.37 u[IU]/mL (ref 0.35–5.50)

## 2023-09-27 NOTE — Progress Notes (Signed)
   Subjective:    Patient ID: Laura Munoz, female    DOB: Mar 27, 1956, 67 y.o.   MRN: [id number]  HPI CPE- UTD on pap, mammo, colonoscopy, Tdap.  Patient Care Team    Relationship Specialty Notifications Start End  Sheliah Hatch, MD PCP - General Family Medicine  02/06/18   Meryl Dare, MD Consulting Physician Gastroenterology  04/02/11   Enid Baas, MD Consulting Physician Family Medicine  04/02/11   Jerene Bears, MD Consulting Physician Gynecology  02/06/18   Cherlyn Roberts, MD Consulting Physician Dermatology  02/06/18   Clide Deutscher, MD Referring Physician Ophthalmology  09/27/23      Health Maintenance  Topic Date Due   HIV Screening  Never done   INFLUENZA VACCINE  06/27/2023   COVID-19 Vaccine (6 - 2023-24 season) 07/28/2023   MAMMOGRAM  11/12/2024   Cervical Cancer Screening (HPV/Pap Cotest)  12/14/2025   DTaP/Tdap/Td (3 - Td or Tdap) 07/20/2028   Colonoscopy  11/16/2029   Hepatitis C Screening  Completed   Zoster Vaccines- Shingrix  Completed   HPV VACCINES  Aged Out      Review of Systems Patient reports no vision/ hearing changes, adenopathy,fever, weight change,  persistant/recurrent hoarseness , swallowing issues, chest pain, palpitations, edema, persistant/recurrent cough, hemoptysis, dyspnea (rest/exertional/paroxysmal nocturnal), gastrointestinal bleeding (melena, rectal bleeding), abdominal pain, significant heartburn, bowel changes, GU symptoms (dysuria, hematuria, incontinence), Gyn symptoms (abnormal  bleeding, pain),  syncope, focal weakness, memory loss, numbness & tingling, skin/hair/nail changes, abnormal bruising or bleeding, anxiety, or depression.     Objective:   Physical Exam General Appearance:    Alert, cooperative, no distress, appears stated age  Head:    Normocephalic, without obvious abnormality, atraumatic  Eyes:    PERRL, conjunctiva/corneas clear, EOM's intact both eyes  Ears:    Normal TM's and external ear  canals, both ears  Nose:   Nares normal, septum midline, mucosa normal, no drainage    or sinus tenderness  Throat:   Lips, mucosa, and tongue normal; teeth and gums normal  Neck:   Supple, symmetrical, trachea midline, no adenopathy;    Thyroid: no enlargement/tenderness/nodules  Back:     Symmetric, no curvature, ROM normal, no CVA tenderness  Lungs:     Clear to auscultation bilaterally, respirations unlabored  Chest Wall:    No tenderness or deformity   Heart:    Regular rate and rhythm, S1 and S2 normal, no murmur, rub   or gallop  Breast Exam:    Deferred to GYN  Abdomen:     Soft, non-tender, bowel sounds active all four quadrants,    no masses, no organomegaly  Genitalia:    Deferred to GYN  Rectal:    Extremities:   Extremities normal, atraumatic, no cyanosis or edema  Pulses:   2+ and symmetric all extremities  Skin:   Skin color, texture, turgor normal, no rashes or lesions  Lymph nodes:   Cervical, supraclavicular, and axillary nodes normal  Neurologic:   CNII-XII intact, normal strength, sensation and reflexes    throughout          Assessment & Plan:

## 2023-09-27 NOTE — Patient Instructions (Signed)
Follow up in 1 year or as needed We'll notify you of your lab results and make any changes if needed Keep up the good work on healthy diet and regular exercise- you look great! Call with any questions or concerns Stay Safe!  Stay Healthy! Happy Fall!!!

## 2023-09-28 ENCOUNTER — Encounter: Payer: Self-pay | Admitting: Family Medicine

## 2023-09-28 LAB — HIV ANTIBODY (ROUTINE TESTING W REFLEX): HIV 1&2 Ab, 4th Generation: NONREACTIVE

## 2023-09-30 ENCOUNTER — Telehealth: Payer: Self-pay

## 2023-09-30 NOTE — Telephone Encounter (Signed)
-----   Message from Neena Rhymes sent at 09/30/2023  7:44 AM EST ----- Labs look great!  No changes at this time

## 2023-09-30 NOTE — Telephone Encounter (Signed)
Lvm for patient letting her know her labs were normal. Asked that she call back with any questions.

## 2023-10-26 NOTE — Assessment & Plan Note (Signed)
Pt's PE WNL.  UTD on pap, mammo, colonoscopy, Tdap.  Check labs.  Anticipatory guidance provided.  

## 2023-11-04 ENCOUNTER — Other Ambulatory Visit: Payer: Self-pay | Admitting: Obstetrics & Gynecology

## 2023-11-04 DIAGNOSIS — Z0000 Encounter for general adult medical examination without abnormal findings: Secondary | ICD-10-CM

## 2023-11-14 ENCOUNTER — Inpatient Hospital Stay (HOSPITAL_BASED_OUTPATIENT_CLINIC_OR_DEPARTMENT_OTHER): Admission: RE | Admit: 2023-11-14 | Payer: BC Managed Care – PPO | Source: Ambulatory Visit | Admitting: Radiology

## 2023-11-14 DIAGNOSIS — Z1231 Encounter for screening mammogram for malignant neoplasm of breast: Secondary | ICD-10-CM

## 2023-12-03 ENCOUNTER — Ambulatory Visit: Payer: BC Managed Care – PPO

## 2023-12-10 ENCOUNTER — Ambulatory Visit
Admission: RE | Admit: 2023-12-10 | Discharge: 2023-12-10 | Disposition: A | Discharge status: Home / Self Care | Payer: Medicare Other | Source: Ambulatory Visit | Attending: Obstetrics & Gynecology | Admitting: Obstetrics & Gynecology

## 2023-12-10 DIAGNOSIS — Z0000 Encounter for general adult medical examination without abnormal findings: Secondary | ICD-10-CM

## 2024-01-21 ENCOUNTER — Ambulatory Visit (HOSPITAL_BASED_OUTPATIENT_CLINIC_OR_DEPARTMENT_OTHER): Payer: Medicare Other | Admitting: Obstetrics & Gynecology

## 2024-01-21 ENCOUNTER — Encounter (HOSPITAL_BASED_OUTPATIENT_CLINIC_OR_DEPARTMENT_OTHER): Payer: Self-pay | Admitting: Obstetrics & Gynecology

## 2024-01-21 VITALS — BP 138/76 | HR 86 | Ht 62.25 in | Wt 108.4 lb

## 2024-01-21 DIAGNOSIS — Z01419 Encounter for gynecological examination (general) (routine) without abnormal findings: Secondary | ICD-10-CM | POA: Diagnosis not present

## 2024-01-21 DIAGNOSIS — Z658 Other specified problems related to psychosocial circumstances: Secondary | ICD-10-CM

## 2024-01-21 DIAGNOSIS — M8588 Other specified disorders of bone density and structure, other site: Secondary | ICD-10-CM

## 2024-01-21 DIAGNOSIS — Z79890 Hormone replacement therapy (postmenopausal): Secondary | ICD-10-CM

## 2024-01-21 MED ORDER — SERTRALINE HCL 100 MG PO TABS: ORAL_TABLET | ORAL | 4 refills | Status: AC

## 2024-01-21 MED ORDER — PREMPRO 0.3-1.5 MG PO TABS: 1.0000 | ORAL_TABLET | Freq: Every day | ORAL | 4 refills | Status: AC

## 2024-01-21 NOTE — Progress Notes (Unsigned)
 Breast and Pelvic Exam  Patient name: Laura Munoz MRN [id number]  Date of birth: Mar 07, 1956 Chief Complaint:   Breast and Pelvic Exam  History of Present Illness:   Laura Munoz is a 68 y.o. G0P0000 Caucasian female being seen today for breast and pelvic exam.  Still on Prem/pro that she takes every other day.  She has experienced some hot flashes but she thinks some of this is related to anxiety as well.  We discussed considering daily dosing.  She has not done a coronary calcium score.  Will review with her PCP.    Denies vaginal bleeding.    Her mother lives with her.  This continues to be a major stressor.  Patient's last menstrual period was 10/23/2013.   Last pap 12/10/2023. Results were: NILM w/ HRHPV negative. H/O abnormal pap: no Last mammogram: 12/10/23. Results were: normal. Family h/o breast cancer: yes paternal grandmother Last colonoscopy: 11/17/19. Follow up 10 years.     01/21/2024    1:59 PM 09/27/2023    1:02 PM 04/03/2023    3:50 PM 12/14/2022    9:06 AM 12/08/2021    9:26 AM  Depression screen PHQ 2/9  Decreased Interest 0 0 0 0 0  Down, Depressed, Hopeless 0 1 0 0 1  PHQ - 2 Score 0 1 0 0 1  Altered sleeping  0     Tired, decreased energy  0     Change in appetite  0     Feeling bad or failure about yourself   0     Trouble concentrating  0     Moving slowly or fidgety/restless  0     Suicidal thoughts  0     PHQ-9 Score  1     Difficult doing work/chores  Not difficult at all       Review of Systems:   Pertinent items are noted in HPI  Denies any headaches, blurred vision, fatigue, shortness of breath, chest pain, abdominal pain, abnormal vaginal discharge/itching/odor/irritation, problems with periods, bowel movements, urination, or intercourse unless otherwise stated above.  Pertinent History Reviewed:  Reviewed past medical,surgical, social and family history.   Reviewed problem list, medications and allergies. Physical Assessment:    Vitals:   01/21/24 1355  BP: (!) 144/79  Pulse: 86  Weight: 108 lb 6.4 oz (49.2 kg)  Height: 5' 2.25" (1.581 m)  Body mass index is 19.67 kg/m.        Physical Examination:   General appearance - well appearing, and in no distress  Mental status - alert, oriented to person, place, and time  Psych:  She has a normal mood and affect  Skin - warm and dry, normal color, no suspicious lesions noted  Chest - effort normal, all lung fields clear to auscultation bilaterally  Heart - normal rate and regular rhythm  Neck:  midline trachea, no thyromegaly or nodules  Breasts - breasts appear normal, no suspicious masses, no skin or nipple changes or  axillary nodes  Abdomen - soft, nontender, nondistended, no masses or organomegaly  Pelvic - VULVA: normal appearing vulva with no masses, tenderness or lesions   VAGINA: normal appearing vagina with normal color and discharge, no lesions   CERVIX: normal appearing cervix without discharge or lesions, no CMT  Thin prep pap is not done.  UTERUS: uterus is felt to be normal size, shape, consistency and nontender   ADNEXA: No adnexal masses or tenderness noted.  Rectal - normal rectal, good  sphincter tone, no masses felt.   Extremities:  No swelling or varicosities noted  Chaperone present for exam  Assessment & Plan:  1. Encntr for gyn exam (general) (routine) w/o abn findings (Primary) ***  2. Hormone replacement therapy (HRT) *** - estrogen, conjugated,-medroxyprogesterone (PREMPRO) 0.3-1.5 MG tablet; Take 1 tablet by mouth daily.  Dispense: 90 tablet; Refill: 4  3. Psychosocial stressors *** - sertraline (ZOLOFT) 100 MG tablet; Take 1/2 tab (50mg ) every day.  Dispense: 45 tablet; Refill: 4  4. Osteopenia of lumbar spine ***   No orders of the defined types were placed in this encounter.   Meds:  Meds ordered this encounter  Medications   sertraline (ZOLOFT) 100 MG tablet    Sig: Take 1/2 tab (50mg ) every day.    Dispense:  45  tablet    Refill:  4   estrogen, conjugated,-medroxyprogesterone (PREMPRO) 0.3-1.5 MG tablet    Sig: Take 1 tablet by mouth daily.    Dispense:  90 tablet    Refill:  4    Follow-up: No follow-ups on file.  Jerene Bears, MD 01/21/2024 2:51 PM

## 2024-01-31 ENCOUNTER — Encounter (HOSPITAL_BASED_OUTPATIENT_CLINIC_OR_DEPARTMENT_OTHER): Payer: Self-pay | Admitting: Obstetrics & Gynecology

## 2024-01-31 ENCOUNTER — Other Ambulatory Visit (HOSPITAL_BASED_OUTPATIENT_CLINIC_OR_DEPARTMENT_OTHER): Payer: Self-pay | Admitting: Obstetrics & Gynecology

## 2024-01-31 DIAGNOSIS — E7800 Pure hypercholesterolemia, unspecified: Secondary | ICD-10-CM

## 2024-01-31 DIAGNOSIS — Z79890 Hormone replacement therapy (postmenopausal): Secondary | ICD-10-CM

## 2024-05-12 ENCOUNTER — Ambulatory Visit: Admitting: Sports Medicine

## 2024-05-21 ENCOUNTER — Encounter: Payer: Self-pay | Admitting: Sports Medicine

## 2024-05-21 ENCOUNTER — Ambulatory Visit: Admitting: Sports Medicine

## 2024-05-21 VITALS — BP 119/82 | Ht 62.0 in | Wt 105.0 lb

## 2024-05-21 DIAGNOSIS — M25559 Pain in unspecified hip: Secondary | ICD-10-CM | POA: Diagnosis not present

## 2024-05-21 DIAGNOSIS — M25552 Pain in left hip: Secondary | ICD-10-CM

## 2024-05-21 NOTE — Assessment & Plan Note (Signed)
 Will treat with a home exercise program I think she will show good response to hip abduction home exercise regimen Recheck in 6 weeks Consider ESWT if not improved or x-rays to assess the degree of hip arthritis

## 2024-05-21 NOTE — Progress Notes (Addendum)
 PCP: Mahlon Comer BRAVO, MD  Chief Complaint: left hip pain Subjective:   HPI: Patient is a 68 y.o. female here for left hip pain.  Patient is very active and usually works out 6 times a week.  Patient does a lot of Jazzercise class.  Patient notes pain on the lateral aspect of her hip as well as some pain on the anterior aspect sometimes.  Patient notes that the lateral aspect bothers her more.  The patient has been under a lot of stress recently because her mother who lived with her for the last several years died.  For the past 3 weeks she has had a lot to do to set up the funeral and travel as it was in [place] .  She has been unable to exercise.  She feels that her hip pain worsened during that time.  Addendum: Pain in her hip actually decreased during the 3 week period away from exercise per the patient.  Past Medical History:  Diagnosis Date   Basal cell carcinoma    face   Breast nodule 04/1996   left   Depression    reactive   Iliotibial band syndrome of right side 09/2011   Myalgia    back and hips    Talipes cavus 02/2010    Current Outpatient Medications on File Prior to Visit  Medication Sig Dispense Refill   Calcium Carbonate-Vit D-Min (CALCIUM 1200 PO) Take by mouth daily.     cyanocobalamin 100 MCG tablet Take 100 mcg by mouth daily.     estrogen, conjugated,-medroxyprogesterone (PREMPRO ) 0.3-1.5 MG tablet Take 1 tablet by mouth daily. 90 tablet 4   Multiple Vitamin (MULTIVITAMIN) tablet Take 1 tablet by mouth daily.     pyridOXINE (VITAMIN B-6) 100 MG tablet Take 100 mg by mouth daily.     sertraline  (ZOLOFT ) 100 MG tablet Take 1/2 tab (50mg ) every day. 45 tablet 4   No current facility-administered medications on file prior to visit.    Past Surgical History:  Procedure Laterality Date   DILATATION & CURRETTAGE/HYSTEROSCOPY WITH RESECTOCOPE N/A 07/25/2015   Procedure: DILATATION & CURETTAGE/HYSTEROSCOPY;  Surgeon: Ronal GORMAN Pinal, MD;  Location: [facility];   Service: Gynecology;  Laterality: N/A;   MOHS SURGERY  2021    Allergies  Allergen Reactions   Chocolate    Cocoa Other (See Comments)   Codeine Other (See Comments)    Passes out   Doxycycline Rash    BP 119/82   Ht 5' 2 (1.575 m)   Wt 105 lb (47.6 kg)   LMP 10/23/2013   BMI 19.20 kg/m      10/24/2021   10:49 AM  [facility]  Frequency of aerobic exercise (# of days/week) 6  Average time in minutes 30  Frequency of strengthening activities (# of days/week) 6        No data to display              Objective:  Physical Exam:  Gen: NAD, comfortable in exam room  Inspection reveals no gross abnormalities left hip.  There is mild tenderness palpation of the greater trochanteric area as well as the inguinal region.  Noted decreased range of motion with FADIR.  There is pain noted with flexion against resistance of the hip as well as hip abduction on the left side.  No pain noted on the right side.   Assessment & Plan:  1. Greater trochanteric pain syndrome (Primary) - Patient presenting with greater  trochanteric pain syndrome as well as mild osteoarthritic changes of the left hip joint.  Given patient's pain, we will go ahead and do hip abduction series at this time.  Patient was advised that she may continue doing her exercise classes but to not allow more than 3/4 out of 10 in pain.  Continue doing hip abduction exercises and can take Aleve/ibuprofen  as needed.  If patient needs something stronger can always prescribe meloxicam . - Patient to follow-up in 6 weeks for reevaluation   Reyne Bustle MD, PGY-4  Sports Medicine Fellow [facility]

## 2024-05-25 ENCOUNTER — Encounter: Payer: Self-pay | Admitting: Sports Medicine

## 2024-06-25 ENCOUNTER — Telehealth: Payer: Self-pay

## 2024-06-25 NOTE — Telephone Encounter (Signed)
 Patient would like to schedule a CPE   Copied from CRM [id number]. Topic: Appointments - Scheduling Inquiry for Clinic >> Jun 25, 2024  2:21 PM Mesmerise C wrote: Reason for CRM: Patient would like to schedule a physical advised of Medicare won't pay for physical patient stated if she can pay out of pocket for it or have it ran through her supplemental insurance instead would like it to be scheduled for the week on 11/17 patient can be reached at [phone]

## 2024-07-16 ENCOUNTER — Ambulatory Visit: Admitting: Sports Medicine

## 2024-07-16 VITALS — BP 104/64 | Ht 62.5 in | Wt 108.0 lb

## 2024-07-16 DIAGNOSIS — M25552 Pain in left hip: Secondary | ICD-10-CM | POA: Diagnosis not present

## 2024-07-16 NOTE — Progress Notes (Signed)
 Left Greater Trochanteric Pain Syndrome  Patient returns in follow-up of pain in her left lateral hip.  This was originally associated with some hip flexion and hip abduction weakness based on our exam.  She has been faithful about doing exercises and has also been active with Jazzercise and her more normal exercise regimen.  She has improved but wonders why she still has some days when it is more painful than others. She sleeps on her back so it does not bother her at night and does not awaken her from sleep. She brought information about a visit that we had almost 10 years ago which we did find that she had some degenerative disc change in L5-S1 and wondered if that could be related.  Physical exam Pleasant white female in no acute distress BP 104/64   Ht 5' 2.5 (1.588 m)   Wt 108 lb (49 kg)   LMP 10/23/2013   BMI 19.44 kg/m  Hip range of motion on the left is entirely full but is less flexible and tighter than that of her right hip. FABER is uncomfortable and causes some lateral pain on the left hip Resisted hip flexion and hip abduction still causes some slight pain at the hip but her strength is excellent and has improved from her previous exam She is not palpably tender over her greater trochanter today FADIR is not painful but is slightly tight as is hip flexion on the left

## 2024-07-16 NOTE — Assessment & Plan Note (Signed)
 I reassured the patient that the condition she has is extremely common in all individuals over the age of 62 I think she has actually made excellent progress by improving her strength She should continue with hip exercises and with Jazzercise The next step to make this progress further is also to add some of these exercises with ankle weights with about 3 pounds starting with 1 set of 15 and building to 3 sets of 15  Also wanted to reassure her that while she could have some labral irritation I do not think she has a true labral tear and I am not impressed that she has any significant arthritis in the left hip.  I do think the degenerative L5-S1 disc could contribute to her developing this syndrome around her hip.  I did note in reviewing old chart records that in 2015 she had some similar symptoms that were more acute and resolved at the time.  She will continue with conservative care and home exercises. She will return to see me in 2 to 3 months

## 2024-09-11 ENCOUNTER — Encounter: Payer: Self-pay | Admitting: Family Medicine

## 2024-09-11 ENCOUNTER — Ambulatory Visit: Admitting: Family Medicine

## 2024-09-11 VITALS — BP 104/78 | HR 60 | Temp 97.7°F | Ht 62.5 in | Wt 109.4 lb

## 2024-09-11 DIAGNOSIS — E785 Hyperlipidemia, unspecified: Secondary | ICD-10-CM

## 2024-09-11 DIAGNOSIS — Z0000 Encounter for general adult medical examination without abnormal findings: Secondary | ICD-10-CM | POA: Diagnosis not present

## 2024-09-11 LAB — CBC WITH DIFFERENTIAL/PLATELET
Basophils Absolute: 0 K/uL (ref 0.0–0.1)
Basophils Relative: 1.1 % (ref 0.0–3.0)
Eosinophils Absolute: 0.1 K/uL (ref 0.0–0.7)
Eosinophils Relative: 2.6 % (ref 0.0–5.0)
HCT: 41.1 % (ref 36.0–46.0)
Hemoglobin: 13.5 g/dL (ref 12.0–15.0)
Lymphocytes Relative: 31.3 % (ref 12.0–46.0)
Lymphs Abs: 1.3 K/uL (ref 0.7–4.0)
MCHC: 33 g/dL (ref 30.0–36.0)
MCV: 90.8 fl (ref 78.0–100.0)
Monocytes Absolute: 0.4 K/uL (ref 0.1–1.0)
Monocytes Relative: 10 % (ref 3.0–12.0)
Neutro Abs: 2.2 K/uL (ref 1.4–7.7)
Neutrophils Relative %: 55 % (ref 43.0–77.0)
Platelets: 266 K/uL (ref 150.0–400.0)
RBC: 4.52 Mil/uL (ref 3.87–5.11)
RDW: 13.5 % (ref 11.5–15.5)
WBC: 4 K/uL (ref 4.0–10.5)

## 2024-09-11 LAB — LIPID PANEL
Cholesterol: 212 mg/dL — ABNORMAL HIGH (ref 0–200)
HDL: 69.5 mg/dL (ref >39.00)
LDL Cholesterol: 130 mg/dL — ABNORMAL HIGH (ref 0–99)
NonHDL: 142.26
Total CHOL/HDL Ratio: 3
Triglycerides: 62 mg/dL (ref 0.0–149.0)
VLDL: 12.4 mg/dL (ref 0.0–40.0)

## 2024-09-11 LAB — HEPATIC FUNCTION PANEL
ALT: 25 U/L (ref 0–35)
AST: 29 U/L (ref 0–37)
Albumin: 4.4 g/dL (ref 3.5–5.2)
Alkaline Phosphatase: 44 U/L (ref 39–117)
Bilirubin, Direct: 0.1 mg/dL (ref 0.0–0.3)
Total Bilirubin: 0.3 mg/dL (ref 0.2–1.2)
Total Protein: 7.8 g/dL (ref 6.0–8.3)

## 2024-09-11 LAB — BASIC METABOLIC PANEL WITH GFR
BUN: 23 mg/dL (ref 6–23)
CO2: 32 meq/L (ref 19–32)
Calcium: 9.4 mg/dL (ref 8.4–10.5)
Chloride: 100 meq/L (ref 96–112)
Creatinine, Ser: 0.8 mg/dL (ref 0.40–1.20)
GFR: 77.12 mL/min (ref >60.00)
Glucose, Bld: 88 mg/dL (ref 70–99)
Potassium: 4 meq/L (ref 3.5–5.1)
Sodium: 137 meq/L (ref 135–145)

## 2024-09-11 LAB — TSH: TSH: 4.92 u[IU]/mL (ref 0.35–5.50)

## 2024-09-11 NOTE — Progress Notes (Unsigned)
 Subjective:    Patient ID: Laura Munoz, female    DOB: 27-Dec-1955, 68 y.o.   MRN: [id number]  HPI Here today for CPE.  Risk Factors: Hyperlipidemia- last LDL 114 Physical Activity: exercising regularly Fall Risk: low Depression: denies current sxs Hearing: normal to conversational tones and whispered voice at 6 feet ADL's: independent Cognitive: normal Home Safety: safe at home, lives w/ husband Height, Weight, BMI, Visual Acuity: see vitals, vision corrected w/ glasses Counseling: UTD on pap, mammo, colonoscopy, PNA, flu Tdap Health Care POA/Living Will: has both Labs Ordered: See A&P Care Plan: See A&P   Addiction risk: social alcohol, no use of controlled substances or illegal drugs.  Low risk of addiction  Past medical, surgical, family, and social hx reviewed for relevance and changes  Health Maintenance  Topic Date Due   Medicare Annual Wellness (AWV)  Never done   COVID-19 Vaccine (8 - Pfizer risk 2025-26 season) 02/20/2025   Mammogram  12/09/2025   Cervical Cancer Screening (HPV/Pap Cotest)  12/15/2027   DTaP/Tdap/Td (3 - Td or Tdap) 07/20/2028   Colonoscopy  11/16/2029   Pneumococcal Vaccine: 50+ Years  Completed   Influenza Vaccine  Completed   DEXA SCAN  Completed   Hepatitis C Screening  Completed   HIV Screening  Completed   Zoster Vaccines- Shingrix  Completed   Hepatitis B Vaccines 19-59 Average Risk  Aged Out   Meningococcal B Vaccine  Aged Out    Patient Care Team    Relationship Specialty Notifications Start End  Mahlon Comer BRAVO, MD PCP - General Family Medicine  02/06/18   Aneita Gwendlyn DASEN, MD (Inactive) Consulting Physician Gastroenterology  04/02/11   Harvey Seltzer, MD Consulting Physician Family Medicine  04/02/11   Cleotilde Ronal RAMAN, MD Consulting Physician Gynecology  02/06/18   Ivin Kocher, MD Consulting Physician Dermatology  02/06/18   Beverlee Modesto GAILS, MD Referring Physician Ophthalmology  09/27/23      Review of  Systems Patient reports no vision/ hearing changes, adenopathy,fever, weight change,  persistant/recurrent hoarseness , swallowing issues, chest pain, palpitations, edema, persistant/recurrent cough, hemoptysis, dyspnea (rest/exertional/paroxysmal nocturnal), gastrointestinal bleeding (melena, rectal bleeding), abdominal pain, significant heartburn, bowel changes, GU symptoms (dysuria, hematuria, incontinence), Gyn symptoms (abnormal  bleeding, pain),  syncope, focal weakness, memory loss, numbness & tingling, skin/hair/nail changes, abnormal bruising or bleeding, anxiety, or depression.     Objective:   Physical Exam General Appearance:    Alert, cooperative, no distress, appears stated age  Head:    Normocephalic, without obvious abnormality, atraumatic  Eyes:    PERRL, conjunctiva/corneas clear, EOM's intact both eyes  Ears:    Normal TM's and external ear canals, both ears  Nose:   Nares normal, septum midline, mucosa normal, no drainage    or sinus tenderness  Throat:   Lips, mucosa, and tongue normal; teeth and gums normal  Neck:   Supple, symmetrical, trachea midline, no adenopathy;    Thyroid : no enlargement/tenderness/nodules  Back:     Symmetric, no curvature, ROM normal, no CVA tenderness  Lungs:     Clear to auscultation bilaterally, respirations unlabored  Chest Wall:    No tenderness or deformity   Heart:    Regular rate and rhythm, S1 and S2 normal, no murmur, rub   or gallop  Breast Exam:    Deferred to GYN  Abdomen:     Soft, non-tender, bowel sounds active all four quadrants,    no masses, no organomegaly  Genitalia:    Deferred  to GYN  Rectal:    Extremities:   Extremities normal, atraumatic, no cyanosis or edema  Pulses:   2+ and symmetric all extremities  Skin:   Skin color, texture, turgor normal, no rashes or lesions  Lymph nodes:   Cervical, supraclavicular, and axillary nodes normal  Neurologic:   CNII-XII intact, normal strength, sensation and reflexes     throughout          Assessment & Plan:

## 2024-09-11 NOTE — Patient Instructions (Signed)
 Follow up in 1 year or as needed We'll notify you of your lab results and make any changes if needed Keep up the good work on healthy diet and regular exercise- you look FANTASTIC! Call with any questions or concerns Stay Safe!  Stay Healthy! Happy Fall!!!

## 2024-09-13 ENCOUNTER — Encounter: Payer: Self-pay | Admitting: Family Medicine

## 2024-09-13 NOTE — Assessment & Plan Note (Signed)
 Pt's WTM PE WNL.  UTD on pap, mammo, colonoscopy, PNA, flu, Tdap.  Exercises regularly.  Low fall risk.  Independent for ADL's.  Check labs.

## 2024-09-14 ENCOUNTER — Other Ambulatory Visit: Payer: Self-pay

## 2024-09-14 ENCOUNTER — Ambulatory Visit: Payer: Self-pay | Admitting: Family Medicine

## 2024-09-14 DIAGNOSIS — E785 Hyperlipidemia, unspecified: Secondary | ICD-10-CM

## 2024-10-01 ENCOUNTER — Ambulatory Visit (INDEPENDENT_AMBULATORY_CARE_PROVIDER_SITE_OTHER): Admitting: Sports Medicine

## 2024-10-01 ENCOUNTER — Ambulatory Visit
Admission: RE | Admit: 2024-10-01 | Discharge: 2024-10-01 | Disposition: A | Discharge status: Home / Self Care | Source: Ambulatory Visit | Attending: Sports Medicine | Admitting: Sports Medicine

## 2024-10-01 ENCOUNTER — Other Ambulatory Visit: Payer: Self-pay

## 2024-10-01 VITALS — BP 108/71 | Ht 62.0 in | Wt 108.0 lb

## 2024-10-01 DIAGNOSIS — M25552 Pain in left hip: Secondary | ICD-10-CM | POA: Diagnosis present

## 2024-10-01 DIAGNOSIS — M25852 Other specified joint disorders, left hip: Secondary | ICD-10-CM

## 2024-10-01 DIAGNOSIS — M545 Low back pain, unspecified: Secondary | ICD-10-CM

## 2024-10-01 DIAGNOSIS — M25752 Osteophyte, left hip: Secondary | ICD-10-CM | POA: Diagnosis not present

## 2024-10-01 NOTE — Progress Notes (Addendum)
 Chief complaint left hip pain  The patient has had some persistent weakness of her hip abductors on the left in particular but now is having more extensive groin pain.  The groin pain worsened after a trip to [place] where she did a lot of walking.  Occasionally when she moves her hip in a certain way she will get a very sharp pain that radiates down the front of her thigh all the way to her knee.  She remains very fit doing Jazzercise and very regular exercise.  She did do the hip abduction exercises we gave her and she felt like she was stronger but that it did not really relieve the type of groin pain she has been getting.  Physical exam Very fit lady who looks younger than her stated age BP 108/71   Ht 5' 2 (1.575 m)   Wt 108 lb (49 kg)   LMP 10/23/2013   BMI 19.75 kg/m   Left hip examination Compared to prior visits there is now some limitation of internal rotation on the left FADIR is now positive Hip flexion against resistance can cause some pain External rotation remains normal The right hip shows a normal range of motion  Straight leg raise is totally negative No palpable back pain  Ultrasound of the left hip There is an anterior spur off the acetabulum There is generally a smooth femoral head with 1 small spur noted There is no hip effusion Doppler flow around the hip is normal Iliopsoas muscle and tendon look normal as do the other hip flexors Labrum shows some calcifications There is 1 loose calcification and it looks like the spur may have a crack in 1 area  Impression: Mild spurring of the left femoral acetabular joint consistent with an impingement syndrome and some probable degenerative labral changes  Ultrasound and interpretation by Helene NOVAK. Harvey, MD

## 2024-10-01 NOTE — Assessment & Plan Note (Addendum)
 Patient with symptoms consistent with anterior impingement of the left hip Currently we will continue with conservative treatment as she gets a lot of relief taking even 1 Aleve at nighttime She can continue this We will get an x-ray of her left hip but also check her lumbar spine as she had some degenerative disc change even 10 years ago I do not think this is referred pain at this time  She may continue exercising but modify to exercises that do not cause any pain I will plan to see her after about 6 weeks of conservative care and see if she is getting enough relief. If not we could consider either injection or shockwave.  10/08/24 Phone FU Discussed XRs - deg. Disk change at L2/3 and L5/S1 Mild OA left hip with some spurring Advised that back may contribute to hip weakness She is doing well with no pain after using Aleve and with taking jumping out of her exercise regimen. We will continue with conservative care FU in Dec.  10/29/24 phone call More pain sometimes deeper in buttocks.  Frustrated that she is losing ability to do some of motions she could do a few months ago. I think her intermittent sxs are still consistent with the Lumbar DDD and the hip OA on the left.  I did not encourage MRI at this time without clearer idea Of how this would change our treatment.  We will review all of this on 12/16 follow up visit and see if other TX or Testing warranted. Laura Haddock, MD

## 2024-10-29 ENCOUNTER — Encounter: Payer: Self-pay | Admitting: Sports Medicine

## 2024-11-10 ENCOUNTER — Ambulatory Visit: Admitting: Sports Medicine

## 2024-11-10 VITALS — BP 114/74 | Ht 62.0 in | Wt 108.0 lb

## 2024-11-10 DIAGNOSIS — M79605 Pain in left leg: Secondary | ICD-10-CM

## 2024-11-10 DIAGNOSIS — M25752 Osteophyte, left hip: Secondary | ICD-10-CM

## 2024-11-10 DIAGNOSIS — M545 Low back pain, unspecified: Secondary | ICD-10-CM | POA: Diagnosis present

## 2024-11-10 NOTE — Assessment & Plan Note (Signed)
 I did discuss that I think the changes to her hip are sufficient enough to cause the limitations that she is experiencing We have options if her pain gets too severe to either use medications more regularly or to consider hip injection If the hip deteriorates we would get an opinion from an orthopedic surgeon My personal's opinion is that her exam and x-ray are not consistent with the need for any hip replacement in the short-term  We discussed ways to modify her activity so she can stay very active without aggravating the groin pain she is experiencing when she does too much hip flexion or hip internal rotation

## 2024-11-10 NOTE — Assessment & Plan Note (Signed)
 She has managed her low back pain very well by staying very active with exercise classes and Jazzercise I suspect the lumbar degenerative disc and arthritic changes probably contribute to some of the persistent weakness on the left hip along with the intrinsic arthritis  I think she needs to continue the exercise regimen she does assess keeping her back very functional

## 2024-11-10 NOTE — Progress Notes (Signed)
 Discussed the use of AI scribe software for clinical note transcription with the patient, who gave verbal consent to proceed.  History of Present Illness Laura Munoz is a 68 year old female with chronic left hip pain who presents for evaluation of worsening left hip pain and functional limitation.  Left hip and groin pain - Chronic left hip and groin pain with progressive worsening over several years - Notable increase in severity and functional limitation in recent months - Pain occasionally radiates proximally - Exacerbated by hip flexion, squatting, weight-bearing, and prolonged walking - Severe pain after exercise class two weeks ago, limiting household chores - Severe pain with increased walking at [employer] in late October and early November - Cleaning bathrooms last Monday caused significant pain - Activity modification, including avoiding high-impact and deep flexion movements, provides partial relief - Able to participate in exercise classes by focusing on upper body work - Kneeling in church is tolerable if she moves cautiously - Concerned about pain with prolonged walking limiting future travel and activities  Functional limitation and strength deficit - Reduced left hip abduction strength - Restriction with stretches and exercises such as butterfly stretches and lateral leg lifts - Maintains function with activity modification but concerned about progression and possible need for surgery  Prior evaluation and management - Prior hip x-ray and ultrasound completed - Inquires about possible labral tear and further imaging - Uses naproxen as needed with some benefit - Does not take regular pain medication  Physical Exam GENERAL: Thin and fit for age. MUSCULOSKELETAL: Right hip full extension without pain, external rotation normal in figure four position, flexion to 130 degrees, external rotation 80 degrees, internal rotation 35 degrees, extremely strong.  Left  hip full extension without pain to >90 degrees, lacks 20 degrees in figure four position, starts external rotation at 100 degrees flexion, external rotation at 90 degrees knee flexion, external rotation 80 degrees, internal rotation 10 degrees, lateral leg lift moderately good, 20% reduction in abduction strength, flexion strength good.  Gait with 3-4 degrees turnout on left hip, no Trendelenburg.  Impressively good lumbar flexion, hands on floor with knees straight, back extension to 35 degrees, full lateral bend, good lumbar spine rotation.  Assessment and Plan Assessment & Plan Lumbar spondylosis Chronic lumbar spondylosis with progressive degenerative changes at multiple lumbar levels. Maintained good lumbar range of motion and function. Symptoms manageable with conservative management. - Advised activity modification to avoid pain exacerbation and respect activity thresholds. - Recommended continuation of non-discomforting exercises. - Discussed benefit of rest during symptom flares. - Scheduled follow-up in three months to monitor symptoms and function. - Instructed to contact clinic if pain becomes severe or functional status deteriorates.  Left hip osteoarthritis with anterior impingement              .pe

## 2024-11-11 ENCOUNTER — Other Ambulatory Visit: Payer: Self-pay | Admitting: Obstetrics & Gynecology

## 2024-11-11 DIAGNOSIS — Z1231 Encounter for screening mammogram for malignant neoplasm of breast: Secondary | ICD-10-CM

## 2024-11-12 ENCOUNTER — Other Ambulatory Visit: Payer: Medicare Other

## 2024-12-11 ENCOUNTER — Ambulatory Visit
Admission: RE | Admit: 2024-12-11 | Discharge: 2024-12-11 | Disposition: A | Discharge status: Home / Self Care | Source: Ambulatory Visit

## 2024-12-11 DIAGNOSIS — Z1231 Encounter for screening mammogram for malignant neoplasm of breast: Secondary | ICD-10-CM

## 2024-12-30 ENCOUNTER — Ambulatory Visit (HOSPITAL_BASED_OUTPATIENT_CLINIC_OR_DEPARTMENT_OTHER)
Admission: RE | Admit: 2024-12-30 | Discharge: 2024-12-30 | Disposition: A | Source: Ambulatory Visit | Attending: Obstetrics & Gynecology | Admitting: Obstetrics & Gynecology

## 2024-12-30 DIAGNOSIS — M8588 Other specified disorders of bone density and structure, other site: Secondary | ICD-10-CM

## 2025-01-22 ENCOUNTER — Ambulatory Visit (HOSPITAL_BASED_OUTPATIENT_CLINIC_OR_DEPARTMENT_OTHER): Payer: Medicare Other | Admitting: Obstetrics & Gynecology

## 2025-01-26 ENCOUNTER — Ambulatory Visit (HOSPITAL_BASED_OUTPATIENT_CLINIC_OR_DEPARTMENT_OTHER)

## 2025-02-09 ENCOUNTER — Ambulatory Visit: Admitting: Sports Medicine

## 2025-03-12 ENCOUNTER — Other Ambulatory Visit
